# Patient Record
Sex: Female | Born: 1955
Health system: Southern US, Community
[De-identification: ages and names within clinical notes are randomized; demographics above are authoritative.]

## PROBLEM LIST (undated history)

## (undated) DIAGNOSIS — E785 Hyperlipidemia, unspecified: Secondary | ICD-10-CM

## (undated) DIAGNOSIS — F329 Major depressive disorder, single episode, unspecified: Secondary | ICD-10-CM

## (undated) DIAGNOSIS — Z9889 Other specified postprocedural states: Secondary | ICD-10-CM

## (undated) DIAGNOSIS — T7840XA Allergy, unspecified, initial encounter: Secondary | ICD-10-CM

## (undated) DIAGNOSIS — D649 Anemia, unspecified: Secondary | ICD-10-CM

## (undated) DIAGNOSIS — I10 Essential (primary) hypertension: Secondary | ICD-10-CM

## (undated) DIAGNOSIS — S0300XA Dislocation of jaw, unspecified side, initial encounter: Secondary | ICD-10-CM

## (undated) DIAGNOSIS — F32A Depression, unspecified: Secondary | ICD-10-CM

## (undated) DIAGNOSIS — H269 Unspecified cataract: Secondary | ICD-10-CM

## (undated) DIAGNOSIS — R112 Nausea with vomiting, unspecified: Secondary | ICD-10-CM

## (undated) DIAGNOSIS — N951 Menopausal and female climacteric states: Secondary | ICD-10-CM

## (undated) DIAGNOSIS — M1990 Unspecified osteoarthritis, unspecified site: Secondary | ICD-10-CM

## (undated) DIAGNOSIS — M8580 Other specified disorders of bone density and structure, unspecified site: Secondary | ICD-10-CM

## (undated) HISTORY — DX: Depression, unspecified: F32.A

## (undated) HISTORY — DX: Anemia, unspecified: D64.9

## (undated) HISTORY — DX: Essential (primary) hypertension: I10

## (undated) HISTORY — PX: OTHER SURGICAL HISTORY: SHX169

## (undated) HISTORY — DX: Allergy, unspecified, initial encounter: T78.40XA

## (undated) HISTORY — DX: Menopausal and female climacteric states: N95.1

## (undated) HISTORY — DX: Unspecified osteoarthritis, unspecified site: M19.90

## (undated) HISTORY — DX: Other specified postprocedural states: Z98.890

## (undated) HISTORY — DX: Major depressive disorder, single episode, unspecified: F32.9

## (undated) HISTORY — DX: Hyperlipidemia, unspecified: E78.5

## (undated) HISTORY — PX: TONSILLECTOMY: SUR1361

## (undated) HISTORY — DX: Other specified disorders of bone density and structure, unspecified site: M85.80

## (undated) HISTORY — DX: Dislocation of jaw, unspecified side, initial encounter: S03.00XA

## (undated) HISTORY — DX: Unspecified cataract: H26.9

## (undated) HISTORY — DX: Nausea with vomiting, unspecified: R11.2

---

## 2000-04-07 ENCOUNTER — Encounter: Payer: Self-pay | Admitting: Internal Medicine

## 2000-04-07 ENCOUNTER — Encounter: Admission: RE | Admit: 2000-04-07 | Discharge: 2000-04-07 | Payer: Self-pay | Admitting: Internal Medicine

## 2000-06-19 ENCOUNTER — Other Ambulatory Visit: Admission: RE | Admit: 2000-06-19 | Discharge: 2000-06-19 | Payer: Self-pay | Admitting: Internal Medicine

## 2000-12-22 HISTORY — PX: ANKLE SURGERY: SHX546

## 2001-03-02 ENCOUNTER — Encounter: Payer: Self-pay | Admitting: Orthopedic Surgery

## 2001-03-02 ENCOUNTER — Ambulatory Visit (HOSPITAL_COMMUNITY): Admission: RE | Admit: 2001-03-02 | Discharge: 2001-03-02 | Payer: Self-pay | Admitting: Orthopedic Surgery

## 2001-04-15 ENCOUNTER — Encounter: Payer: Self-pay | Admitting: Internal Medicine

## 2001-04-15 ENCOUNTER — Encounter: Admission: RE | Admit: 2001-04-15 | Discharge: 2001-04-15 | Payer: Self-pay | Admitting: Internal Medicine

## 2001-06-17 ENCOUNTER — Other Ambulatory Visit: Admission: RE | Admit: 2001-06-17 | Discharge: 2001-06-17 | Payer: Self-pay | Admitting: Internal Medicine

## 2001-08-09 ENCOUNTER — Encounter: Admission: RE | Admit: 2001-08-09 | Discharge: 2001-08-09 | Payer: Self-pay | Admitting: Internal Medicine

## 2001-08-09 ENCOUNTER — Encounter: Payer: Self-pay | Admitting: Internal Medicine

## 2002-06-02 ENCOUNTER — Other Ambulatory Visit: Admission: RE | Admit: 2002-06-02 | Discharge: 2002-06-02 | Payer: Self-pay | Admitting: Internal Medicine

## 2002-06-29 ENCOUNTER — Encounter: Payer: Self-pay | Admitting: Internal Medicine

## 2002-06-29 ENCOUNTER — Ambulatory Visit (HOSPITAL_COMMUNITY): Admission: RE | Admit: 2002-06-29 | Discharge: 2002-06-29 | Payer: Self-pay | Admitting: Internal Medicine

## 2003-06-16 ENCOUNTER — Other Ambulatory Visit: Admission: RE | Admit: 2003-06-16 | Discharge: 2003-06-16 | Payer: Self-pay | Admitting: Internal Medicine

## 2003-08-07 ENCOUNTER — Encounter: Payer: Self-pay | Admitting: Internal Medicine

## 2003-08-07 ENCOUNTER — Ambulatory Visit (HOSPITAL_COMMUNITY): Admission: RE | Admit: 2003-08-07 | Discharge: 2003-08-07 | Payer: Self-pay | Admitting: Internal Medicine

## 2004-08-14 ENCOUNTER — Encounter: Admission: RE | Admit: 2004-08-14 | Discharge: 2004-08-14 | Payer: Self-pay | Admitting: Internal Medicine

## 2004-08-28 ENCOUNTER — Encounter: Admission: RE | Admit: 2004-08-28 | Discharge: 2004-08-28 | Payer: Self-pay | Admitting: Internal Medicine

## 2004-11-26 ENCOUNTER — Other Ambulatory Visit: Admission: RE | Admit: 2004-11-26 | Discharge: 2004-11-26 | Payer: Self-pay | Admitting: Obstetrics and Gynecology

## 2005-11-19 ENCOUNTER — Encounter: Admission: RE | Admit: 2005-11-19 | Discharge: 2005-11-19 | Payer: Self-pay | Admitting: Internal Medicine

## 2007-08-13 ENCOUNTER — Emergency Department: Payer: Self-pay | Admitting: Emergency Medicine

## 2008-05-18 ENCOUNTER — Other Ambulatory Visit: Admission: RE | Admit: 2008-05-18 | Discharge: 2008-05-18 | Payer: Self-pay | Admitting: Internal Medicine

## 2008-12-22 HISTORY — PX: ROTATOR CUFF REPAIR: SHX139

## 2009-05-02 ENCOUNTER — Ambulatory Visit: Payer: Self-pay | Admitting: Internal Medicine

## 2009-06-22 ENCOUNTER — Other Ambulatory Visit: Admission: RE | Admit: 2009-06-22 | Discharge: 2009-06-22 | Payer: Self-pay | Admitting: Internal Medicine

## 2009-06-22 ENCOUNTER — Ambulatory Visit: Payer: Self-pay | Admitting: Internal Medicine

## 2009-07-16 ENCOUNTER — Encounter: Admission: RE | Admit: 2009-07-16 | Discharge: 2009-07-16 | Payer: Self-pay | Admitting: Internal Medicine

## 2009-08-13 ENCOUNTER — Ambulatory Visit: Payer: Self-pay | Admitting: Internal Medicine

## 2009-09-17 ENCOUNTER — Encounter: Admission: RE | Admit: 2009-09-17 | Discharge: 2009-09-17 | Payer: Self-pay | Admitting: Orthopedic Surgery

## 2009-10-02 ENCOUNTER — Ambulatory Visit: Payer: Self-pay | Admitting: Internal Medicine

## 2010-02-08 ENCOUNTER — Ambulatory Visit: Payer: Self-pay | Admitting: Internal Medicine

## 2010-02-22 ENCOUNTER — Ambulatory Visit: Payer: Self-pay | Admitting: Internal Medicine

## 2010-08-12 ENCOUNTER — Encounter: Admission: RE | Admit: 2010-08-12 | Discharge: 2010-08-12 | Payer: Self-pay | Admitting: Internal Medicine

## 2010-09-17 ENCOUNTER — Ambulatory Visit: Payer: Self-pay | Admitting: Internal Medicine

## 2010-09-17 ENCOUNTER — Other Ambulatory Visit: Admission: RE | Admit: 2010-09-17 | Discharge: 2010-09-17 | Payer: Self-pay | Admitting: Internal Medicine

## 2010-10-15 ENCOUNTER — Ambulatory Visit: Payer: Self-pay | Admitting: Internal Medicine

## 2011-01-02 ENCOUNTER — Ambulatory Visit
Admission: RE | Admit: 2011-01-02 | Discharge: 2011-01-02 | Payer: Self-pay | Source: Home / Self Care | Attending: Internal Medicine | Admitting: Internal Medicine

## 2011-01-12 ENCOUNTER — Encounter: Payer: Self-pay | Admitting: Internal Medicine

## 2011-03-18 ENCOUNTER — Ambulatory Visit (INDEPENDENT_AMBULATORY_CARE_PROVIDER_SITE_OTHER): Payer: Self-pay | Admitting: Internal Medicine

## 2011-03-18 DIAGNOSIS — I10 Essential (primary) hypertension: Secondary | ICD-10-CM

## 2011-09-01 ENCOUNTER — Other Ambulatory Visit: Payer: Self-pay | Admitting: Internal Medicine

## 2011-09-02 ENCOUNTER — Other Ambulatory Visit: Payer: Self-pay | Admitting: Internal Medicine

## 2011-09-09 ENCOUNTER — Other Ambulatory Visit: Payer: Self-pay | Admitting: Internal Medicine

## 2011-09-09 DIAGNOSIS — Z1231 Encounter for screening mammogram for malignant neoplasm of breast: Secondary | ICD-10-CM

## 2011-09-22 ENCOUNTER — Ambulatory Visit
Admission: RE | Admit: 2011-09-22 | Discharge: 2011-09-22 | Disposition: A | Discharge status: Home / Self Care | Payer: 59 | Source: Ambulatory Visit | Attending: Internal Medicine | Admitting: Internal Medicine

## 2011-09-22 DIAGNOSIS — Z1231 Encounter for screening mammogram for malignant neoplasm of breast: Secondary | ICD-10-CM

## 2011-09-23 ENCOUNTER — Encounter: Payer: Self-pay | Admitting: Internal Medicine

## 2011-09-26 ENCOUNTER — Encounter: Payer: Self-pay | Admitting: Internal Medicine

## 2011-09-30 ENCOUNTER — Encounter: Payer: Self-pay | Admitting: Internal Medicine

## 2011-09-30 ENCOUNTER — Ambulatory Visit (INDEPENDENT_AMBULATORY_CARE_PROVIDER_SITE_OTHER): Payer: 59 | Admitting: Internal Medicine

## 2011-09-30 VITALS — BP 109/62 | HR 78 | Temp 97.7°F | Ht 62.0 in | Wt 159.0 lb

## 2011-09-30 DIAGNOSIS — I10 Essential (primary) hypertension: Secondary | ICD-10-CM

## 2011-09-30 DIAGNOSIS — M8580 Other specified disorders of bone density and structure, unspecified site: Secondary | ICD-10-CM

## 2011-09-30 DIAGNOSIS — F329 Major depressive disorder, single episode, unspecified: Secondary | ICD-10-CM

## 2011-09-30 DIAGNOSIS — F32A Depression, unspecified: Secondary | ICD-10-CM

## 2011-09-30 DIAGNOSIS — Z23 Encounter for immunization: Secondary | ICD-10-CM

## 2011-09-30 DIAGNOSIS — E785 Hyperlipidemia, unspecified: Secondary | ICD-10-CM

## 2011-09-30 DIAGNOSIS — Z0000 Encounter for general adult medical examination without abnormal findings: Secondary | ICD-10-CM

## 2011-09-30 MED ORDER — LOSARTAN POTASSIUM 100 MG PO TABS: 100.0000 mg | ORAL_TABLET | Freq: Every day | ORAL | Status: DC

## 2011-10-13 ENCOUNTER — Encounter: Payer: Self-pay | Admitting: Internal Medicine

## 2011-10-14 ENCOUNTER — Telehealth: Payer: Self-pay

## 2011-10-14 NOTE — Telephone Encounter (Signed)
Order placed in Epic for Amb Ref to [facility] for screening colon

## 2011-10-26 DIAGNOSIS — F32A Depression, unspecified: Secondary | ICD-10-CM | POA: Insufficient documentation

## 2011-10-26 DIAGNOSIS — I10 Essential (primary) hypertension: Secondary | ICD-10-CM | POA: Insufficient documentation

## 2011-10-26 DIAGNOSIS — E785 Hyperlipidemia, unspecified: Secondary | ICD-10-CM | POA: Insufficient documentation

## 2011-10-26 DIAGNOSIS — F329 Major depressive disorder, single episode, unspecified: Secondary | ICD-10-CM | POA: Insufficient documentation

## 2011-10-26 DIAGNOSIS — M8580 Other specified disorders of bone density and structure, unspecified site: Secondary | ICD-10-CM | POA: Insufficient documentation

## 2011-10-26 NOTE — Patient Instructions (Signed)
Try the diet exercise and lose a bit of weight. Return in 6 months for office visit and blood pressure check. Check fasting lipid panel at that time.

## 2011-10-26 NOTE — Progress Notes (Signed)
  Subjective:    Patient ID: Taylor Moreno, female    DOB: December 21, 1956, 55 y.o.   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 History of hypertension, depression, hyperlipidemia, hot flashes, osteopenia. History of fractured left wrist in the late 1980s. History of iron deficiency anemia 1999 related to menses. Agrees to have colonoscopy January 2013. Had mammogram August 2011. No known drug allergies but Fosamax causes GI upset.  Had tonsillectomy as a teenager, right knee surgery for torn cartilage December 2002, left knee surgery for torn cartilage October 2003. Had tetanus immunization 2008.  Has been a patient in this practice since 1990.  Used to smoke cigarettes. Quit smoking in the mid 1980s but formerly smoked one half to a pack cigarettes daily for 8 years.. Social alcohol consumption. Completed college.  Family history parents in good health 2 sisters in good health  Social history patient is single never married no children    Review of Systems  Constitutional: Negative.   HENT: Negative.   Eyes: Negative.   Respiratory: Negative.   Cardiovascular: Negative.   Gastrointestinal: Negative.   Genitourinary: Negative.   Musculoskeletal: Negative.   Neurological: Negative.   Hematological: Negative.   Psychiatric/Behavioral: Negative.        Objective:   Physical Exam  Vitals reviewed. Constitutional: She is oriented to person, place, and time. She appears well-developed and well-nourished.  HENT:  Head: Normocephalic and atraumatic.  Right Ear: External ear normal.  Left Ear: External ear normal.  Mouth/Throat: Oropharynx is clear and moist. No oropharyngeal exudate.  Eyes: Conjunctivae and EOM are normal. Pupils are equal, round, and reactive to light. No scleral icterus.  Neck: Neck supple. No JVD present. No thyromegaly present.  Cardiovascular:  Normal rate, regular rhythm, normal heart sounds and intact distal pulses.   No murmur heard. Pulmonary/Chest: Effort normal and breath sounds normal. She has no wheezes. She has no rales.       Breasts normal female  Abdominal: Soft. Bowel sounds are normal. She exhibits no mass. There is no tenderness.  Genitourinary:       Deferred last Pap September 2011  Musculoskeletal: Normal range of motion. She exhibits no edema.  Lymphadenopathy:    She has no cervical adenopathy.  Neurological: She is alert and oriented to person, place, and time. She has normal reflexes. She displays normal reflexes. No cranial nerve deficit. Coordination normal.  Skin: Skin is warm and dry. No rash noted.  Psychiatric: She has a normal mood and affect. Her behavior is normal.          Assessment & Plan:  Pression  Hypertension  Hyperlipidemia  Osteopenia  Hot flashes  Plan: Patient is to continue with same regimen and return in 6 months for office visit, lipid panel blood pressure check. Patient is currently not taking lipid-lowering medication. Says it caused myalgias

## 2011-12-08 ENCOUNTER — Other Ambulatory Visit: Payer: Self-pay | Admitting: Internal Medicine

## 2011-12-20 ENCOUNTER — Other Ambulatory Visit: Payer: Self-pay | Admitting: Internal Medicine

## 2011-12-23 HISTORY — PX: COLONOSCOPY: SHX174

## 2012-02-09 ENCOUNTER — Encounter: Payer: Self-pay | Admitting: Gastroenterology

## 2012-03-23 ENCOUNTER — Encounter: Payer: Self-pay | Admitting: Gastroenterology

## 2012-04-02 ENCOUNTER — Ambulatory Visit (INDEPENDENT_AMBULATORY_CARE_PROVIDER_SITE_OTHER): Payer: 59 | Admitting: Internal Medicine

## 2012-04-02 ENCOUNTER — Encounter: Payer: Self-pay | Admitting: Internal Medicine

## 2012-04-02 VITALS — BP 126/78 | HR 76 | Temp 99.1°F | Wt 167.5 lb

## 2012-04-02 DIAGNOSIS — F3289 Other specified depressive episodes: Secondary | ICD-10-CM

## 2012-04-02 DIAGNOSIS — I10 Essential (primary) hypertension: Secondary | ICD-10-CM

## 2012-04-02 DIAGNOSIS — E669 Obesity, unspecified: Secondary | ICD-10-CM

## 2012-04-02 DIAGNOSIS — F329 Major depressive disorder, single episode, unspecified: Secondary | ICD-10-CM

## 2012-04-02 DIAGNOSIS — F32A Depression, unspecified: Secondary | ICD-10-CM

## 2012-04-02 DIAGNOSIS — E785 Hyperlipidemia, unspecified: Secondary | ICD-10-CM

## 2012-04-02 DIAGNOSIS — R7309 Other abnormal glucose: Secondary | ICD-10-CM

## 2012-04-02 DIAGNOSIS — R7302 Impaired glucose tolerance (oral): Secondary | ICD-10-CM

## 2012-04-13 ENCOUNTER — Ambulatory Visit (INDEPENDENT_AMBULATORY_CARE_PROVIDER_SITE_OTHER): Payer: 59 | Admitting: Internal Medicine

## 2012-04-13 ENCOUNTER — Encounter: Payer: Self-pay | Admitting: Internal Medicine

## 2012-04-13 VITALS — BP 130/72 | HR 88 | Temp 99.4°F | Resp 24 | Ht 63.0 in | Wt 168.0 lb

## 2012-04-13 DIAGNOSIS — H6500 Acute serous otitis media, unspecified ear: Secondary | ICD-10-CM

## 2012-04-13 DIAGNOSIS — J988 Other specified respiratory disorders: Secondary | ICD-10-CM

## 2012-04-13 DIAGNOSIS — J40 Bronchitis, not specified as acute or chronic: Secondary | ICD-10-CM

## 2012-04-13 DIAGNOSIS — J9801 Acute bronchospasm: Secondary | ICD-10-CM

## 2012-04-13 DIAGNOSIS — H6503 Acute serous otitis media, bilateral: Secondary | ICD-10-CM

## 2012-04-13 MED ORDER — ALBUTEROL SULFATE (5 MG/ML) 0.5% IN NEBU
2.5000 mg | INHALATION_SOLUTION | Freq: Once | RESPIRATORY_TRACT | Status: AC
  Administered 2012-04-13: 2.5 mg via RESPIRATORY_TRACT

## 2012-04-13 NOTE — Patient Instructions (Signed)
Take prednisone in tapering course as prescribed. Take antibiotics as prescribed. Use inhaler 4 times daily.

## 2012-04-13 NOTE — Progress Notes (Signed)
  Subjective:    Patient ID: Taylor Moreno, female    DOB: [birth year], 56 y.o.   MRN: [id number]  HPI 56 year old white female who developed respiratory infection 6 days ago. Has had fever up to 102 with coughing and wheezing. Some discolored sputum production. No discolored nasal drainage. Ears feel congested.    Review of Systems     Objective:   Physical Exam chest exam bilateral rhonchi and wheezing. Was given DuoNeb nebulizer treatment in the office. Pulse oximetry 93% on room air before treatment. TMs are full bilaterally but not red; pharynx is clear.        Assessment & Plan:  Bronchitis  Bronchospasm  Acute bilateral serous otitis media  Plan: Albuterol inhaler 2 sprays by mouth 4 times a day, Sterapred DS 10 mg 6 day dosepak take as directed; Levaquin 500 milligrams daily for 10 days. Diflucan 150 mg tablet with one refill should Candida vaginitis developed on antibiotics.

## 2012-04-19 ENCOUNTER — Ambulatory Visit (AMBULATORY_SURGERY_CENTER): Payer: 59 | Admitting: *Deleted

## 2012-04-19 VITALS — Ht 63.0 in | Wt 170.0 lb

## 2012-04-19 DIAGNOSIS — Z1211 Encounter for screening for malignant neoplasm of colon: Secondary | ICD-10-CM

## 2012-04-19 MED ORDER — PEG-KCL-NACL-NASULF-NA ASC-C 100 G PO SOLR: ORAL | Status: DC

## 2012-04-20 ENCOUNTER — Encounter: Payer: Self-pay | Admitting: Gastroenterology

## 2012-04-21 NOTE — Patient Instructions (Addendum)
Please try the diet exercise and lose weight. Continue same medications and return for physical exam in 6 months. Lab work in 6 months to be done by [facility] should include hemoglobin A1c

## 2012-04-21 NOTE — Progress Notes (Signed)
  Subjective:    Patient ID: Taylor Moreno, female    DOB: 27-Oct-1956, 56 y.o.   MRN: [id number]  HPI 56 year old white female with history of hypertension and depression in today for six-month recheck. No complaints or problems. Feels well. Treated with losartan 100 mg daily for hypertension. Takes Lexapro generic 10 mg daily. Issues with obesity. Doesn't exercise very much. History of mild hyperlipidemia with labs done by [employer] where she is employed September 2012 showing total cholesterol 216, HDL cholesterol 71 normal triglycerides and LDL cholesterol of 129. Tried Lipitor in 2008 but discontinued it. History of osteopenia. History of colonoscopy January 2013. Also at risk for glucose intolerance with fasting serum glucose September 2012 of 160    Review of Systems     Objective:   Physical Exam chest clear.dictation, cardiac exam regular rate and rhythm, extremities without edema.        Assessment & Plan:  Hypertension  Hyperlipidemia  Obesity  Osteopenia  History of depression  Suspect glucose intolerance  Plan: Return in 6 months for physical examination. Encouraged diet and exercise and weight loss.

## 2012-05-03 ENCOUNTER — Encounter: Payer: Self-pay | Admitting: Gastroenterology

## 2012-05-03 ENCOUNTER — Ambulatory Visit (AMBULATORY_SURGERY_CENTER): Payer: 59 | Admitting: Gastroenterology

## 2012-05-03 VITALS — BP 131/92 | HR 70 | Temp 98.3°F | Resp 14 | Ht 63.0 in | Wt 170.0 lb

## 2012-05-03 DIAGNOSIS — Z1211 Encounter for screening for malignant neoplasm of colon: Secondary | ICD-10-CM

## 2012-05-03 MED ORDER — SODIUM CHLORIDE 0.9 % IV SOLN: 500.0000 mL | INTRAVENOUS | Status: DC

## 2012-05-03 NOTE — Progress Notes (Signed)
Pressure applied to the abdomen to reach cecum. 

## 2012-05-03 NOTE — Patient Instructions (Signed)

## 2012-05-03 NOTE — Op Note (Signed)
[facility] [address] [place], [place]  [address]  COLONOSCOPY PROCEDURE REPORT  PATIENT:  Taylor Moreno, Taylor Moreno  MR#:  [id number] BIRTHDATE:  [birth year], 56 yrs. old  GENDER:  female ENDOSCOPIST:  Vania Rea. Jarold Motto, MD, [facility] REF. BY:  Sharlet Salina, M.D. PROCEDURE DATE:  05/03/2012 PROCEDURE:  Average-risk screening colonoscopy G0121 ASA CLASS:  Class II INDICATIONS:  Routine Risk Screening MEDICATIONS:   propofol (Diprivan) 350 mg IV  DESCRIPTION OF PROCEDURE:   After the risks and benefits and of the procedure were explained, informed consent was obtained. Digital rectal exam was performed and revealed no abnormalities. The LB CF-[id number] endoscope was introduced through the anus and advanced to the cecum, which was identified by both the appendix and ileocecal valve.  The quality of the prep was excellent, using MoviPrep.  The instrument was then slowly withdrawn as the colon was fully examined. <<PROCEDUREIMAGES>>  FINDINGS:  No polyps or cancers were seen.  This was otherwise a normal examination of the colon.   Retroflexed views in the rectum revealed no abnormalities.    The scope was then withdrawn from the patient and the procedure completed.  COMPLICATIONS:  None ENDOSCOPIC IMPRESSION: 1) No polyps or cancers 2) Otherwise normal examination RECOMMENDATIONS: 1) Continue current colorectal screening recommendations for "routine risk" patients with a repeat colonoscopy in 10 years.  REPEAT EXAM:  No  ______________________________ Vania Rea. Jarold Motto, MD, [facility]  CC:  n. eSIGNED:   Vania Rea. Meena Barrantes at 05/03/2012 11:32 AM  Guerry Minors, [phone]

## 2012-05-03 NOTE — Progress Notes (Signed)
Patient did not experience any of the following events: a burn prior to discharge; a fall within the facility; wrong site/side/patient/procedure/implant event; or a hospital transfer or hospital admission upon discharge from the facility. (G8907) Patient did not have preoperative order for IV antibiotic SSI prophylaxis. (G8918)  

## 2012-05-04 ENCOUNTER — Telehealth: Payer: Self-pay | Admitting: *Deleted

## 2012-05-04 NOTE — Telephone Encounter (Signed)
  Follow up Call-  Call back number 05/03/2012  Post procedure Call Back phone  # [phone]  Permission to leave phone message Yes     Left message on answering machine.

## 2012-07-09 ENCOUNTER — Encounter: Payer: Self-pay | Admitting: Internal Medicine

## 2012-07-09 ENCOUNTER — Ambulatory Visit (INDEPENDENT_AMBULATORY_CARE_PROVIDER_SITE_OTHER): Payer: 59 | Admitting: Internal Medicine

## 2012-07-09 VITALS — BP 132/80 | HR 84 | Temp 99.5°F | Wt 168.0 lb

## 2012-07-09 DIAGNOSIS — F32A Depression, unspecified: Secondary | ICD-10-CM

## 2012-07-09 DIAGNOSIS — R229 Localized swelling, mass and lump, unspecified: Secondary | ICD-10-CM

## 2012-07-09 DIAGNOSIS — F329 Major depressive disorder, single episode, unspecified: Secondary | ICD-10-CM

## 2012-07-09 DIAGNOSIS — Z6379 Other stressful life events affecting family and household: Secondary | ICD-10-CM

## 2012-07-09 DIAGNOSIS — R2240 Localized swelling, mass and lump, unspecified lower limb: Secondary | ICD-10-CM

## 2012-07-09 NOTE — Progress Notes (Signed)
  Subjective:    Patient ID: Taylor Moreno, female    DOB: Jul 24, 1956, 56 y.o.   MRN: [id number]  HPI Mother has had 2 strokes. First stroke was in May and was a hemorrhagic stroke on anticoagulants;  the second one was an embolic stroke from atrial fibrillation. She is now under the care of Hospice. Her father is 71 years old. Taylor Moreno has been a lot of time with her parents recently. She's been under some stress. She noticed a nodule recently on the sole of her left foot. as it was pointed out by a nail technician. It's tender to touch. She also needs refills on her antidepressant medication. Doesn't recall any injury to the foot or any foreign body in foot.      Review of Systems     Objective:   Physical Exam Pea-sized superficial nodule sole of left foot just below the first metatarsal head. It is tender to palpation. There is no erythema or drainage from this nodule.       Assessment & Plan:  Benign nodule sole of left foot  Depression  Family stress  Plan: Patient is going to monitor it for now. If pain persists, we will consider getting an MRI of the foot. Possibilities include Morton's neuroma or just a benign nodule.  Fax when necessary one year refills on medications to mail order prescription plan.

## 2012-07-09 NOTE — Patient Instructions (Addendum)
Observe foot mass for enlargement. Wear comfortable shoes. May take ibuprofen for pain. May use ice for 20 minutes daily on sole of foot if painful.

## 2012-07-21 DIAGNOSIS — Z0289 Encounter for other administrative examinations: Secondary | ICD-10-CM

## 2012-08-26 ENCOUNTER — Other Ambulatory Visit: Payer: Self-pay | Admitting: Internal Medicine

## 2012-08-26 DIAGNOSIS — Z1231 Encounter for screening mammogram for malignant neoplasm of breast: Secondary | ICD-10-CM

## 2012-09-21 ENCOUNTER — Telehealth: Payer: Self-pay | Admitting: Internal Medicine

## 2012-09-21 NOTE — Telephone Encounter (Signed)
Order for fasting labs please

## 2012-09-22 NOTE — Telephone Encounter (Signed)
Order faxed to [facility] for bloodwork, as requested.

## 2012-09-23 ENCOUNTER — Ambulatory Visit
Admission: RE | Admit: 2012-09-23 | Discharge: 2012-09-23 | Disposition: A | Discharge status: Home / Self Care | Payer: 59 | Source: Ambulatory Visit | Attending: Internal Medicine | Admitting: Internal Medicine

## 2012-09-23 DIAGNOSIS — Z1231 Encounter for screening mammogram for malignant neoplasm of breast: Secondary | ICD-10-CM

## 2012-10-04 ENCOUNTER — Ambulatory Visit (INDEPENDENT_AMBULATORY_CARE_PROVIDER_SITE_OTHER): Payer: 59 | Admitting: Internal Medicine

## 2012-10-04 ENCOUNTER — Encounter: Payer: Self-pay | Admitting: Internal Medicine

## 2012-10-04 VITALS — BP 108/64 | HR 92 | Temp 99.2°F | Ht 62.5 in | Wt 171.0 lb

## 2012-10-04 DIAGNOSIS — M899 Disorder of bone, unspecified: Secondary | ICD-10-CM

## 2012-10-04 DIAGNOSIS — I10 Essential (primary) hypertension: Secondary | ICD-10-CM

## 2012-10-04 DIAGNOSIS — F32A Depression, unspecified: Secondary | ICD-10-CM

## 2012-10-04 DIAGNOSIS — E785 Hyperlipidemia, unspecified: Secondary | ICD-10-CM

## 2012-10-04 DIAGNOSIS — F439 Reaction to severe stress, unspecified: Secondary | ICD-10-CM

## 2012-10-04 DIAGNOSIS — Z733 Stress, not elsewhere classified: Secondary | ICD-10-CM

## 2012-10-04 DIAGNOSIS — F329 Major depressive disorder, single episode, unspecified: Secondary | ICD-10-CM

## 2012-10-04 DIAGNOSIS — M8580 Other specified disorders of bone density and structure, unspecified site: Secondary | ICD-10-CM

## 2012-10-04 DIAGNOSIS — Z23 Encounter for immunization: Secondary | ICD-10-CM

## 2012-10-04 NOTE — Progress Notes (Signed)
Subjective:    Patient ID: Taylor Moreno, female    DOB: 17-Sep-1956, 56 y.o.   MRN: [id number]  HPI 56 year old white female with history of hypertension, hyperlipidemia, osteopenia and depression as well as obesity in today for health maintenance and evaluation of medical problems. No known drug allergies. Fosamax causes GI symptoms.  History of tonsillectomy as a teenager. History of right knee surgery for torn cartilage December 2002. Left knee surgery by Dr. Brynda Greathouse for torn cartilage October 2003.  Colonoscopy done January 2013. Gets annual mammogram. Is on calcium and vitamin D supplement. Takes Lexapro 10 mg one half tablet daily. Was on Lipitor starting in 2008 but discontinued it. Started on Cozaar September 2011.  Social history: She is single. Works in Consulting civil engineer for [employer]. Formerly smoked a half pack daily for some 10 years. Quit smoking around 1984. Has a college education. In the 1990s she had a boyfriend. Have her she says she's not seeing anyone at the present time. Social alcohol consumption. Fractured left wrist in the late 1980s. This occurred secondary to catching her arm posterior Barnetta Chapel had some hyper rotational injury resulting in fracture.  Family history: Parents living but her aging and have issues with dementia and needs assistance.  2 sisters in good health. No children. Never married.  Recent lab work done through [employer] shows LDL cholesterol of 144 and total cholesterol of 2:30. TSH normal at 3.020. Vitamin D normal at 37.5. Serum glucose normal. CBC normal.    Review of Systems  Constitutional: Positive for fatigue.       A lot of time taking care of parents  HENT: Negative.   Eyes: Negative.   Respiratory: Negative.   Cardiovascular: Negative.   Gastrointestinal: Negative.   Endocrine: Negative.   Genitourinary: Negative.   Neurological: Negative.   Psychiatric/Behavioral:       History of depression       Objective:   Physical Exam  Nursing note  reviewed. Constitutional: She is oriented to person, place, and time. She appears well-developed and well-nourished. No distress.  HENT:  Head: Normocephalic and atraumatic.  Left Ear: External ear normal.  Mouth/Throat: Oropharynx is clear and moist. No oropharyngeal exudate.  Eyes: Conjunctivae and EOM are normal. Pupils are equal, round, and reactive to light. Right eye exhibits no discharge. Left eye exhibits no discharge.  Neck: Neck supple. No JVD present. No thyromegaly present.  Cardiovascular: Normal rate, regular rhythm, normal heart sounds and intact distal pulses.   No murmur heard. Pulmonary/Chest: Breath sounds normal. No respiratory distress. She has no wheezes. She has no rales. She exhibits no tenderness.  Breasts normal female  Abdominal: Soft. Bowel sounds are normal. She exhibits no distension and no mass. There is no tenderness. There is no rebound and no guarding.  Musculoskeletal: She exhibits no edema.  Neurological: She is alert and oriented to person, place, and time. She has normal reflexes. No cranial nerve deficit. Coordination normal.  Skin: No rash noted. She is not diaphoretic.  Psychiatric: She has a normal mood and affect. Her behavior is normal. Judgment and thought content normal.          Assessment & Plan:  History of depression  History of hypertension  History of hyperlipidemia does not want to be on statin therapy  Osteopenia-treat with vitamin D and calcium  Plan: Continue Cozaar 100 mg daily. Continue low-dose Lexapro. Prescription given to have zoster vaccine done if pharmacy. Influenza immunization given here today. Return in 6  months.

## 2013-03-06 NOTE — Patient Instructions (Addendum)
Continue same medications and return in 6 months. Try the diet exercise. Need repeat lipid panel in 6 months.

## 2013-03-28 ENCOUNTER — Encounter: Payer: Self-pay | Admitting: Internal Medicine

## 2013-03-28 ENCOUNTER — Ambulatory Visit (INDEPENDENT_AMBULATORY_CARE_PROVIDER_SITE_OTHER): Payer: 59 | Admitting: Internal Medicine

## 2013-03-28 VITALS — BP 132/76 | HR 84 | Temp 99.6°F | Wt 173.0 lb

## 2013-03-28 DIAGNOSIS — F32A Depression, unspecified: Secondary | ICD-10-CM

## 2013-03-28 DIAGNOSIS — F329 Major depressive disorder, single episode, unspecified: Secondary | ICD-10-CM

## 2013-03-28 DIAGNOSIS — E785 Hyperlipidemia, unspecified: Secondary | ICD-10-CM

## 2013-03-28 DIAGNOSIS — I10 Essential (primary) hypertension: Secondary | ICD-10-CM

## 2013-08-18 ENCOUNTER — Encounter: Payer: Self-pay | Admitting: Internal Medicine

## 2013-08-18 ENCOUNTER — Ambulatory Visit (INDEPENDENT_AMBULATORY_CARE_PROVIDER_SITE_OTHER): Payer: 59 | Admitting: Internal Medicine

## 2013-08-18 VITALS — BP 120/76 | HR 80 | Temp 99.4°F | Wt 173.0 lb

## 2013-08-18 DIAGNOSIS — R040 Epistaxis: Secondary | ICD-10-CM

## 2013-08-18 LAB — CBC WITH DIFFERENTIAL/PLATELET
Basophils Absolute: 0.1 10*3/uL (ref 0.0–0.1)
HCT: 36.6 % (ref 36.0–46.0)
Hemoglobin: 12.3 g/dL (ref 12.0–15.0)
Lymphs Abs: 1.8 10*3/uL (ref 0.7–4.0)
MCH: 31.9 pg (ref 26.0–34.0)
MCHC: 33.6 g/dL (ref 30.0–36.0)
MCV: 94.8 fL (ref 78.0–100.0)
Neutrophils Relative %: 53 % (ref 43–77)
RBC: 3.86 MIL/uL — ABNORMAL LOW (ref 3.87–5.11)

## 2013-08-18 LAB — APTT: aPTT: 31 seconds (ref 24–37)

## 2013-08-18 LAB — PROTIME-INR: INR: 0.9 (ref <1.50)

## 2013-08-18 NOTE — Addendum Note (Signed)
Addended by: Judy Pimple on: 08/18/2013 11:29 AM   Modules accepted: Orders

## 2013-08-18 NOTE — Progress Notes (Signed)
  Subjective:    Patient ID: Taylor Moreno, female    DOB: 02-Aug-1956, 57 y.o.   MRN: [id number]  HPI Had 30 minutes nosebleed yesterday despite pinching nostrils. Happening more frequently. Has been an issue for a couple of years. Stopped Allegra thinking it was drying out nasal tissue and stopped Flonase for same reason. History of allergic rhinitis.  Hx of  well controlled. Hx of depression treated with SSRI. Denies taking ASA and NSAIDS.    Review of Systems     Objective:   Physical Exam Boggy nasal mucosa left nostril with no active bleeding. BP stable on medication        Assessment & Plan:  Epistaxis Allergic Rhinitis  HTN- well controlled  Plan: ENT evaluation. CBC with diff, PT, PTT drawn

## 2013-08-18 NOTE — Patient Instructions (Addendum)
Refer to ENT. Labs pending

## 2013-09-02 ENCOUNTER — Other Ambulatory Visit: Payer: Self-pay

## 2013-09-02 ENCOUNTER — Other Ambulatory Visit: Payer: Self-pay | Admitting: Internal Medicine

## 2013-09-02 DIAGNOSIS — M8580 Other specified disorders of bone density and structure, unspecified site: Secondary | ICD-10-CM

## 2013-09-02 DIAGNOSIS — Z1231 Encounter for screening mammogram for malignant neoplasm of breast: Secondary | ICD-10-CM

## 2013-09-21 ENCOUNTER — Encounter: Payer: Self-pay | Admitting: Internal Medicine

## 2013-09-21 NOTE — Patient Instructions (Addendum)
Continue same meds and return in 6 months for physical exam

## 2013-09-21 NOTE — Progress Notes (Signed)
  Subjective:    Patient ID: Taylor Moreno, female    DOB: Oct 16, 1956, 57 y.o.   MRN: [id number]  HPI In today for six-month recheck of hypertension, hyperlipidemia and depression. Having family issues taking care of elderly father. Not able to diet and exercise very much because of stress. Continues to work full-time at [employer].   Review of Systems     Objective:   Physical Exam Neck is supple without JVD thyromegaly or carotid bruits. Chest clear to auscultation. Cardiac exam regular rate and rhythm normal S1 and S2. Extremities without edema       Assessment & Plan:  Depression-symptoms stable given situational stress  Situational stress with elderly parents  Hypertension-stable on losartan.  Hyperlipidemia-stable. Not on statin therapy.  Plan: Return in 6 months for physical exam. Continue same medications.  Labs are done through employment at [employer] so they are not an Epic. In the future they need to be scanned into Epic

## 2013-09-26 ENCOUNTER — Ambulatory Visit
Admission: RE | Admit: 2013-09-26 | Discharge: 2013-09-26 | Disposition: A | Discharge status: Home / Self Care | Payer: 59 | Source: Ambulatory Visit

## 2013-09-26 ENCOUNTER — Ambulatory Visit
Admission: RE | Admit: 2013-09-26 | Discharge: 2013-09-26 | Disposition: A | Discharge status: Home / Self Care | Payer: 59 | Source: Ambulatory Visit | Attending: Internal Medicine | Admitting: Internal Medicine

## 2013-09-26 DIAGNOSIS — Z1231 Encounter for screening mammogram for malignant neoplasm of breast: Secondary | ICD-10-CM

## 2013-09-26 DIAGNOSIS — M8580 Other specified disorders of bone density and structure, unspecified site: Secondary | ICD-10-CM

## 2013-10-04 ENCOUNTER — Ambulatory Visit (INDEPENDENT_AMBULATORY_CARE_PROVIDER_SITE_OTHER): Payer: 59 | Admitting: Internal Medicine

## 2013-10-04 ENCOUNTER — Other Ambulatory Visit (HOSPITAL_COMMUNITY)
Admission: RE | Admit: 2013-10-04 | Discharge: 2013-10-04 | Disposition: A | Discharge status: Home / Self Care | Payer: 59 | Source: Ambulatory Visit | Attending: Internal Medicine | Admitting: Internal Medicine

## 2013-10-04 ENCOUNTER — Encounter: Payer: Self-pay | Admitting: Internal Medicine

## 2013-10-04 VITALS — BP 148/80 | Temp 99.6°F | Ht 62.5 in | Wt 178.0 lb

## 2013-10-04 DIAGNOSIS — Z23 Encounter for immunization: Secondary | ICD-10-CM

## 2013-10-04 DIAGNOSIS — Z01419 Encounter for gynecological examination (general) (routine) without abnormal findings: Secondary | ICD-10-CM | POA: Insufficient documentation

## 2013-10-04 DIAGNOSIS — Z0000 Encounter for general adult medical examination without abnormal findings: Secondary | ICD-10-CM

## 2013-10-04 DIAGNOSIS — E785 Hyperlipidemia, unspecified: Secondary | ICD-10-CM

## 2013-10-04 DIAGNOSIS — M899 Disorder of bone, unspecified: Secondary | ICD-10-CM

## 2013-10-04 DIAGNOSIS — I10 Essential (primary) hypertension: Secondary | ICD-10-CM

## 2013-10-04 DIAGNOSIS — F329 Major depressive disorder, single episode, unspecified: Secondary | ICD-10-CM

## 2013-10-04 DIAGNOSIS — F32A Depression, unspecified: Secondary | ICD-10-CM

## 2013-10-04 DIAGNOSIS — M8580 Other specified disorders of bone density and structure, unspecified site: Secondary | ICD-10-CM

## 2013-10-13 ENCOUNTER — Other Ambulatory Visit: Payer: Self-pay | Admitting: Internal Medicine

## 2013-11-20 ENCOUNTER — Other Ambulatory Visit: Payer: Self-pay | Admitting: Internal Medicine

## 2014-01-09 ENCOUNTER — Encounter: Payer: Self-pay | Admitting: Internal Medicine

## 2014-01-09 ENCOUNTER — Ambulatory Visit (INDEPENDENT_AMBULATORY_CARE_PROVIDER_SITE_OTHER): Payer: 59 | Admitting: Internal Medicine

## 2014-01-09 VITALS — BP 136/82 | HR 80 | Temp 99.8°F | Wt 171.0 lb

## 2014-01-09 DIAGNOSIS — J111 Influenza due to unidentified influenza virus with other respiratory manifestations: Secondary | ICD-10-CM

## 2014-01-09 DIAGNOSIS — J209 Acute bronchitis, unspecified: Secondary | ICD-10-CM

## 2014-01-09 MED ORDER — OSELTAMIVIR PHOSPHATE 75 MG PO CAPS: 75.0000 mg | ORAL_CAPSULE | Freq: Two times a day (BID) | ORAL | Status: DC

## 2014-01-09 MED ORDER — HYDROCODONE-HOMATROPINE 5-1.5 MG/5ML PO SYRP: 5.0000 mL | ORAL_SOLUTION | Freq: Three times a day (TID) | ORAL | Status: DC | PRN

## 2014-01-09 MED ORDER — AZITHROMYCIN 250 MG PO TABS: ORAL_TABLET | ORAL | Status: DC

## 2014-01-09 NOTE — Progress Notes (Signed)
   Subjective:    Patient ID: Taylor Moreno, female    DOB: August 19, 1956, 58 y.o.   MRN: [id number]  HPI   Onset evening of January 16th of tickle in throat progressing to cough and fever. Has myalgias, decreased appetite, denies sore throat. No nausea and vomiting. Has had temp up to 103.     Review of Systems     Objective:   Physical Exam looks fatigued. Pharynx only very slightly injected without exudate. TMs clear. Neck supple. Chest clear.        Assessment & Plan:   Influenza  Bronchitis  Plan: Tamiflu 75 mg twice daily for 5 days. Hycodan 8 ounces 1 teaspoon by mouth Q8 hours when necessary cough. Zithromax Z-PAK take 2 tablets day one followed by 1 tablet days 2 through 5. Out of work until afebrile for 48 hours and feeling better.

## 2014-01-09 NOTE — Patient Instructions (Signed)
Take Tamiflu, Zithromax and Hycodan as directed. Out of work until feeling better.

## 2014-02-16 NOTE — Progress Notes (Signed)
Subjective:    Patient ID: Taylor Moreno, female    DOB: 1956-03-05, 58 y.o.   MRN: [id number]  HPI 58 year old white female in today for health maintenance exam and evaluation of medical issues. Has a history of hypertension, hyperlipidemia, osteopenia and depression. Influenza vaccine given.  No known drug allergies. Fosamax causes GI symptoms. Refuses statin medication for hyperlipidemia.  Past medical history: Tonsillectomy as a teenager. History of right knee surgery for torn cartilage December 2002. Left knee surgery by Dr. Pollie Meyer  for torn cartilage October 2003. Fractured left wrist in the late 1980s occurred secondary to catching her arm on steering wheel resulting in some type of rotational injury.  Started Cozaar September 2011. Gets annual mammogram. Takes calcium and vitamin D supplement. Was started on Lipitor in 2008 but discontinued it and refuses to take it.  Colonoscopy done January 2013.  Social history: She is single. 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 Formerly smoked a half pack daily for some 10 years. Quit smoking around 1984. Has a college education. In the 1990s she had a boyfriend. Since then she has not seen anyone seriously. Social alcohol consumption. No children. Never married.  Family history: Parents are living but having issues with dementia. Mother is had stroke and is a nursing facility and this is been very stressful for patient. 2 sisters in good health.     Review of Systems  Constitutional: Positive for fatigue.  HENT: Negative.   Eyes: Negative.   Respiratory: Negative.   Cardiovascular: Negative.   Gastrointestinal: Negative.   Endocrine:       History of hyperlipidemia  Genitourinary: Negative.   Hematological: Negative.   Psychiatric/Behavioral:       Stress and anxiety due to aging parents and mother in nursing facility       Objective:   Physical Exam  Vitals reviewed. Constitutional: She appears well-developed and  well-nourished. No distress.  HENT:  Head: Normocephalic and atraumatic.  Right Ear: External ear normal.  Left Ear: External ear normal.  Mouth/Throat: Oropharynx is clear and moist. No oropharyngeal exudate.  Eyes: Conjunctivae and EOM are normal. Pupils are equal, round, and reactive to light. Right eye exhibits no discharge. Left eye exhibits no discharge. No scleral icterus.  Neck: Normal range of motion. Neck supple. No JVD present. No thyromegaly present.  Cardiovascular: Normal rate, regular rhythm and normal heart sounds.   No murmur heard. Pulmonary/Chest: Effort normal and breath sounds normal. No respiratory distress. She has no rales.  Breasts normal female  Abdominal: Soft. Bowel sounds are normal. She exhibits no distension and no mass. There is no tenderness. There is no rebound and no guarding.  Genitourinary:  Pap taken- bimanual normal  Musculoskeletal: Normal range of motion. She exhibits no edema.  Lymphadenopathy:    She has no cervical adenopathy.  Neurological: She is alert. She has normal reflexes. She displays normal reflexes. No cranial nerve deficit. Coordination normal.  Skin: Skin is warm and dry. No rash noted. She is not diaphoretic.  Psychiatric: She has a normal mood and affect. Her behavior is normal. Judgment and thought content normal.          Assessment & Plan:  Hypertension-stable on current regimen of losartan 100 mg daily  Anxiety depression related to aging parents-continue Lexapro  Hyperlipidemia-patient refuses statin medication. Needs to diet and exercise.  Osteopenia treated with calcium and vitamin D  Plan: Continue same medications above and return in 6 months for office visit, blood pressure  check, fasting lipid panel. She obtains lab work through [employer] where she is employed. I am not pleased with her lipid panel. Suggest she recheck this after trial of diet and exercise in January 2015.

## 2014-02-16 NOTE — Patient Instructions (Signed)
Please watch diet and exercise. Followup lipid panel January 2015. Otherwise return in 6 months

## 2014-04-03 ENCOUNTER — Ambulatory Visit: Payer: 59 | Admitting: Internal Medicine

## 2014-04-10 ENCOUNTER — Ambulatory Visit (INDEPENDENT_AMBULATORY_CARE_PROVIDER_SITE_OTHER): Payer: 59 | Admitting: Internal Medicine

## 2014-04-10 ENCOUNTER — Encounter: Payer: Self-pay | Admitting: Internal Medicine

## 2014-04-10 VITALS — BP 134/72 | HR 88 | Temp 99.8°F | Wt 175.0 lb

## 2014-04-10 DIAGNOSIS — E785 Hyperlipidemia, unspecified: Secondary | ICD-10-CM

## 2014-04-10 DIAGNOSIS — F439 Reaction to severe stress, unspecified: Secondary | ICD-10-CM

## 2014-04-10 DIAGNOSIS — Z8659 Personal history of other mental and behavioral disorders: Secondary | ICD-10-CM

## 2014-04-10 DIAGNOSIS — I10 Essential (primary) hypertension: Secondary | ICD-10-CM

## 2014-04-10 DIAGNOSIS — Z733 Stress, not elsewhere classified: Secondary | ICD-10-CM

## 2014-04-16 ENCOUNTER — Encounter: Payer: Self-pay | Admitting: Internal Medicine

## 2014-04-16 NOTE — Progress Notes (Signed)
   Subjective:    Patient ID: Taylor Moreno, female    DOB: 04-17-1956, 58 y.o.   MRN: [id number]  HPI  In today for six-month recheck on depression, hyperlipidemia and hypertension. Recent lab work January 20 15th at [facility] [employer] where she is employed. Blood pressure rechecked 134/72 after initial reading of 144/72. Father is been diagnosed with prostate cancer. Mother nursing home with history of stroke and dysrhythmia. Patient says blood pressure at home is running around 113/72. Temperature is elevated today but she does not know why. She does not feel ill. In January total cholesterol was 231, triglycerides 174, LDL cholesterol 145. Says she's trying to watch what she eats and is beginning to exercise once again. Remains on losartan 100 mg daily and Lexapro for depression.    Review of Systems     Objective:   Physical Exam Skin warm and dry. Nodes none. Chest clear to auscultation. Cardiac exam regular rate and rhythm. Extremities without edema. Affect normal.       Assessment & Plan:  Hypertension-stable  Depression-stable on Lexapro. Situation with parents is difficult.  Hyperlipidemia-continue diet and exercise efforts. She does not want to be on lipid-lowering medication  Plan: Return in 6 months for physical examination.

## 2014-04-16 NOTE — Patient Instructions (Signed)
Continue same medications. Continue diet and exercise efforts. Return in 6 months.

## 2014-07-06 ENCOUNTER — Other Ambulatory Visit: Payer: Self-pay | Admitting: Internal Medicine

## 2014-09-26 ENCOUNTER — Other Ambulatory Visit: Payer: Self-pay | Admitting: Internal Medicine

## 2014-09-26 DIAGNOSIS — Z0000 Encounter for general adult medical examination without abnormal findings: Secondary | ICD-10-CM

## 2014-09-26 DIAGNOSIS — Z1329 Encounter for screening for other suspected endocrine disorder: Secondary | ICD-10-CM

## 2014-09-26 DIAGNOSIS — E785 Hyperlipidemia, unspecified: Secondary | ICD-10-CM

## 2014-09-26 DIAGNOSIS — I10 Essential (primary) hypertension: Secondary | ICD-10-CM

## 2014-09-26 DIAGNOSIS — Z1321 Encounter for screening for nutritional disorder: Secondary | ICD-10-CM

## 2014-09-26 DIAGNOSIS — R7302 Impaired glucose tolerance (oral): Secondary | ICD-10-CM

## 2014-09-26 NOTE — Progress Notes (Signed)
Patient request lab orders be faxed to her so she can have them drawn at [facility] where she works.  Faxed to [phone]

## 2014-10-04 ENCOUNTER — Other Ambulatory Visit: Payer: Self-pay

## 2014-10-04 DIAGNOSIS — Z1231 Encounter for screening mammogram for malignant neoplasm of breast: Secondary | ICD-10-CM

## 2014-10-09 ENCOUNTER — Encounter: Payer: 59 | Admitting: Internal Medicine

## 2014-10-10 ENCOUNTER — Encounter: Payer: Self-pay | Admitting: Internal Medicine

## 2014-10-12 ENCOUNTER — Other Ambulatory Visit: Payer: Self-pay | Admitting: Internal Medicine

## 2014-10-15 ENCOUNTER — Other Ambulatory Visit: Payer: Self-pay | Admitting: Internal Medicine

## 2014-10-23 ENCOUNTER — Ambulatory Visit
Admission: RE | Admit: 2014-10-23 | Discharge: 2014-10-23 | Disposition: A | Discharge status: Home / Self Care | Payer: 59 | Source: Ambulatory Visit

## 2014-10-23 DIAGNOSIS — Z1231 Encounter for screening mammogram for malignant neoplasm of breast: Secondary | ICD-10-CM

## 2014-11-14 ENCOUNTER — Other Ambulatory Visit: Payer: Self-pay | Admitting: Internal Medicine

## 2014-12-04 ENCOUNTER — Ambulatory Visit (INDEPENDENT_AMBULATORY_CARE_PROVIDER_SITE_OTHER): Payer: 59 | Admitting: Internal Medicine

## 2014-12-04 ENCOUNTER — Telehealth: Payer: Self-pay

## 2014-12-04 ENCOUNTER — Other Ambulatory Visit: Payer: Self-pay | Admitting: Internal Medicine

## 2014-12-04 ENCOUNTER — Encounter: Payer: Self-pay | Admitting: Internal Medicine

## 2014-12-04 VITALS — BP 130/80 | HR 87 | Temp 99.5°F | Ht 62.0 in | Wt 177.0 lb

## 2014-12-04 DIAGNOSIS — Z23 Encounter for immunization: Secondary | ICD-10-CM

## 2014-12-04 DIAGNOSIS — R945 Abnormal results of liver function studies: Principal | ICD-10-CM

## 2014-12-04 DIAGNOSIS — I10 Essential (primary) hypertension: Secondary | ICD-10-CM

## 2014-12-04 DIAGNOSIS — Z87312 Personal history of (healed) stress fracture: Secondary | ICD-10-CM

## 2014-12-04 DIAGNOSIS — E785 Hyperlipidemia, unspecified: Secondary | ICD-10-CM | POA: Diagnosis not present

## 2014-12-04 DIAGNOSIS — F439 Reaction to severe stress, unspecified: Secondary | ICD-10-CM

## 2014-12-04 DIAGNOSIS — Z658 Other specified problems related to psychosocial circumstances: Secondary | ICD-10-CM

## 2014-12-04 DIAGNOSIS — Z8659 Personal history of other mental and behavioral disorders: Secondary | ICD-10-CM | POA: Diagnosis not present

## 2014-12-04 DIAGNOSIS — Z0000 Encounter for general adult medical examination without abnormal findings: Secondary | ICD-10-CM | POA: Diagnosis not present

## 2014-12-04 DIAGNOSIS — M8580 Other specified disorders of bone density and structure, unspecified site: Secondary | ICD-10-CM | POA: Diagnosis not present

## 2014-12-04 DIAGNOSIS — R7989 Other specified abnormal findings of blood chemistry: Secondary | ICD-10-CM

## 2014-12-04 LAB — POCT URINALYSIS DIPSTICK
Bilirubin, UA: NEGATIVE
GLUCOSE UA: NEGATIVE
Ketones, UA: NEGATIVE
Leukocytes, UA: NEGATIVE
Nitrite, UA: NEGATIVE
Protein, UA: NEGATIVE
RBC UA: NEGATIVE
Spec Grav, UA: 1.01
UROBILINOGEN UA: NEGATIVE
pH, UA: 6.5

## 2014-12-04 MED ORDER — ESCITALOPRAM OXALATE 10 MG PO TABS: ORAL_TABLET | ORAL | Status: DC

## 2014-12-04 MED ORDER — IBANDRONATE SODIUM 150 MG PO TABS: ORAL_TABLET | ORAL | Status: DC

## 2014-12-04 NOTE — Telephone Encounter (Signed)
Referral for ultrasound scheduled at [facility] for 12/13/14 at 8 am at [address]

## 2014-12-04 NOTE — Patient Instructions (Signed)
Continue Boniva monthly. Remind us to do bone density study next year.

## 2014-12-13 ENCOUNTER — Ambulatory Visit
Admission: RE | Admit: 2014-12-13 | Discharge: 2014-12-13 | Disposition: A | Discharge status: Home / Self Care | Payer: 59 | Source: Ambulatory Visit | Attending: Internal Medicine | Admitting: Internal Medicine

## 2014-12-13 ENCOUNTER — Telehealth: Payer: Self-pay | Admitting: *Deleted

## 2014-12-13 DIAGNOSIS — R945 Abnormal results of liver function studies: Principal | ICD-10-CM

## 2014-12-13 DIAGNOSIS — R7989 Other specified abnormal findings of blood chemistry: Secondary | ICD-10-CM

## 2014-12-13 NOTE — Telephone Encounter (Signed)
Reviewed instructions with patient .  

## 2014-12-31 NOTE — Progress Notes (Signed)
Subjective:    Patient ID: Taylor Moreno, female    DOB: 1956/12/04, 59 y.o.   MRN: [id number]  HPI 59 year old White Female in today for health maintenance exam and evaluation of medical issues. In July, she was seen Dr. Charlett BlakeVoytek  with a stress fracture of left tibia. She wore a boot until healed. She had bone density study in 2014 showing -2.4 T score in the left femoral neck and -0.7 in the LS-spine. We are going to start her on Boniva.  Hasn't really been able to exercise. Still has issues with elderly parents in caring for them.  No known drug allergies. Fosamax caused GI symptoms. Refuses statin medication for hyperlipidemia.  Past medical history: Tonsillectomy as a teenager. History of right knee surgery for torn cartilage December 2002. Left knee surgery with Dr. Brynda Greathouseandall for torn cartilage October 2003. Fractured left wrist in the 1980s occurred secondary to catching her arm on the steering wheel which resulted in some type of rotational injury.  Takes calcium and vitamin D supplement. Was started on Lipitor in 2008 but discontinued it and refuses to take it. Started Cozaar September 2011.  Colonoscopy January 2013  Social history: She is single. 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 Formerly smoked a half pack cigarettes daily for some 10 years. Quit smoking around 1984. Has college education. In the 1990s, she had a boyfriend. Since then has not seen anyone seriously. Social alcohol consumption. No children. Never married.  Family history: Mother has had stroke and is in a nursing facility. Father with dementia. 2 sisters in good health.    Review of Systems  Constitutional: Positive for fatigue.  Psychiatric/Behavioral:       Situational stress and depression with aging parents  All other systems reviewed and are negative.      Objective:   Physical Exam  Constitutional: She is oriented to person, place, and time. She appears well-developed and well-nourished. No  distress.  HENT:  Head: Normocephalic and atraumatic.  Right Ear: External ear normal.  Left Ear: External ear normal.  Mouth/Throat: Oropharynx is clear and moist. No oropharyngeal exudate.  Eyes: Conjunctivae and EOM are normal. Pupils are equal, round, and reactive to light. Right eye exhibits no discharge. Left eye exhibits no discharge. No scleral icterus.  Neck: Neck supple. No JVD present. No thyromegaly present.  Cardiovascular: Normal rate, regular rhythm and normal heart sounds.   No murmur heard. Pulmonary/Chest: Effort normal and breath sounds normal. No respiratory distress. She has no wheezes. She has no rales.  Breasts normal female  Abdominal: Soft. Bowel sounds are normal. She exhibits no distension and no mass. There is no tenderness. There is no rebound and no guarding.  Genitourinary:  Pap taken 2014. Bimanual normal  Musculoskeletal: Normal range of motion. She exhibits no edema.  Lymphadenopathy:    She has no cervical adenopathy.  Neurological: She is alert and oriented to person, place, and time. She has normal reflexes. Coordination normal.  Skin: Skin is warm and dry. No rash noted. She is not diaphoretic.  Psychiatric: She has a normal mood and affect. Her behavior is normal. Judgment and thought content normal.  Vitals reviewed.         Assessment & Plan:  History of depression maintained on SSRI  Hypertension-stable  Hyperlipidemia-refuses statin therapy  Osteoporosis-start Boniva and see if tolerates this. Previously could not tolerate Fosamax due to GI side effects  Plan: Return in 6 months or as needed.   Fasting lipid  panel shows total cholesterol 248 with an LDL cholesterol of 163. SGPT mildly elevated 56 likely due to fatty liver. TSH is normal. Hemoglobin A1c 5.5 despite elevated serum glucose of 106. These labs were done at her employment with [employer]

## 2015-04-16 ENCOUNTER — Encounter: Payer: Self-pay | Admitting: Podiatry

## 2015-04-16 ENCOUNTER — Ambulatory Visit (INDEPENDENT_AMBULATORY_CARE_PROVIDER_SITE_OTHER): Payer: 59 | Admitting: Podiatry

## 2015-04-16 ENCOUNTER — Ambulatory Visit (INDEPENDENT_AMBULATORY_CARE_PROVIDER_SITE_OTHER): Payer: 59

## 2015-04-16 VITALS — BP 138/79 | HR 85 | Resp 15

## 2015-04-16 DIAGNOSIS — M722 Plantar fascial fibromatosis: Secondary | ICD-10-CM

## 2015-04-16 DIAGNOSIS — M79672 Pain in left foot: Secondary | ICD-10-CM | POA: Diagnosis not present

## 2015-04-16 MED ORDER — TRIAMCINOLONE ACETONIDE 10 MG/ML IJ SUSP
10.0000 mg | Freq: Once | INTRAMUSCULAR | Status: AC
  Administered 2015-04-16: 10 mg

## 2015-04-16 NOTE — Progress Notes (Signed)
   Subjective:    Patient ID: Taylor Moreno, female    DOB: March 11, 1956, 59 y.o.   MRN: [id number]  HPI Pt presents with painful knots on the plantar aspect of her left foot, noticed them approx 3 weeks prior   Review of Systems  All other systems reviewed and are negative.      Objective:   Physical Exam        Assessment & Plan:

## 2015-04-19 NOTE — Progress Notes (Signed)
Subjective:     Patient ID: Taylor Moreno, female   DOB: 1956/03/01, 59 y.o.   MRN: [id number]  HPI patient presents stating these 2 small knot appeared on the bottom of my left arch and they have been painful. I've noticed them being painful for 3 weeks and I'm not sure if they were they're previous to that   Review of Systems  All other systems reviewed and are negative.      Objective:   Physical Exam  Constitutional: She is oriented to person, place, and time.  Cardiovascular: Intact distal pulses.   Musculoskeletal: Normal range of motion.  Neurological: She is oriented to person, place, and time.  Skin: Skin is warm.  Nursing note and vitals reviewed.  neurovascular status intact with muscle strength adequate and range of motion subtalar midtarsal joint within normal limits. 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 I did careful injection of the areas 3 mg Kenalog 5 mg Xylocaine to shrink the inflamed tissue and advised on heat and cold compresses. If symptoms were to persist they were to grow in size or change color we will need to consider excision of these lesions

## 2015-06-11 ENCOUNTER — Encounter: Payer: Self-pay | Admitting: Internal Medicine

## 2015-06-11 ENCOUNTER — Ambulatory Visit (INDEPENDENT_AMBULATORY_CARE_PROVIDER_SITE_OTHER): Payer: 59 | Admitting: Internal Medicine

## 2015-06-11 VITALS — BP 116/64 | HR 90 | Temp 98.0°F | Wt 176.0 lb

## 2015-06-11 DIAGNOSIS — I10 Essential (primary) hypertension: Secondary | ICD-10-CM

## 2015-06-11 DIAGNOSIS — E785 Hyperlipidemia, unspecified: Secondary | ICD-10-CM

## 2015-06-11 DIAGNOSIS — Z820 Family history of epilepsy and other diseases of the nervous system: Secondary | ICD-10-CM

## 2015-06-11 DIAGNOSIS — Z818 Family history of other mental and behavioral disorders: Secondary | ICD-10-CM

## 2015-06-11 MED ORDER — ROSUVASTATIN CALCIUM 5 MG PO TABS: 5.0000 mg | ORAL_TABLET | Freq: Every day | ORAL | Status: DC

## 2015-06-11 NOTE — Progress Notes (Signed)
   Subjective:    Patient ID: Taylor Moreno, female    DOB: 1956/09/05, 59 y.o.   MRN: [id number]  HPI Patient continues to have issues with hyperlipidemia. Total cholesterol was 257. She claims to have had side effects when she took statin medication in the remote past. However, her family history of dementia in father and stroke in her mother would I think encourage her to take statin medication once again. Triglycerides remain slightly elevated in the 160 range.  She also has a history of essential hypertension and blood pressures under excellent control on current regimen  Review of Systems noncontributory     Objective:   Physical Exam  Not examined. Reviewed lipid panel with her.      Assessment & Plan:  Hypercholesterolemia-total cholesterol 257, LDL cholesterol 171 Hypertriglyceridemia-triglycerides 167  Essential hypertension  Family history of stroke  Family history of dementia  Plan: Trial of Crestor 5 mg daily. Return in 3 months. We'll have lipid panel and liver functions drawn before office visit at [employer] where she is employed.

## 2015-06-11 NOTE — Patient Instructions (Signed)
Start Crestor 5 mg daily. Have lipid panel liver functions with office visit in 3 months

## 2015-09-10 ENCOUNTER — Ambulatory Visit: Payer: 59 | Admitting: Internal Medicine

## 2015-09-21 ENCOUNTER — Encounter: Payer: Self-pay | Admitting: Internal Medicine

## 2015-09-24 ENCOUNTER — Encounter: Payer: Self-pay | Admitting: Internal Medicine

## 2015-09-24 ENCOUNTER — Ambulatory Visit (INDEPENDENT_AMBULATORY_CARE_PROVIDER_SITE_OTHER): Payer: 59 | Admitting: Internal Medicine

## 2015-09-24 VITALS — BP 130/82 | HR 80 | Temp 98.5°F | Ht 62.0 in | Wt 177.5 lb

## 2015-09-24 DIAGNOSIS — Z23 Encounter for immunization: Secondary | ICD-10-CM

## 2015-09-24 DIAGNOSIS — I10 Essential (primary) hypertension: Secondary | ICD-10-CM

## 2015-09-24 DIAGNOSIS — F32A Depression, unspecified: Secondary | ICD-10-CM

## 2015-09-24 DIAGNOSIS — E785 Hyperlipidemia, unspecified: Secondary | ICD-10-CM

## 2015-09-24 DIAGNOSIS — F329 Major depressive disorder, single episode, unspecified: Secondary | ICD-10-CM

## 2015-09-24 DIAGNOSIS — R748 Abnormal levels of other serum enzymes: Secondary | ICD-10-CM

## 2015-09-24 NOTE — Progress Notes (Signed)
   Subjective:    Patient ID: Taylor Moreno, female    DOB: 25-Sep-1956, 59 y.o.   MRN: [id number]  HPI  Mother diagnosed with cerebral amyloid angiopathy.Has had another stroke. Father with dementia.  Did not start Crestor for hyperlipidemia. Walking some. Total cholesterol is 218 and was 257. Triglycerides are 186 and were about 160. Liver functions are normal except for 36. Normal is 32. Trying to eat healthier. To get flu vaccine. Despite stress with family, seems to be coping fairly well   Review of Systems     Objective:   Physical Exam  Spent 25 minutes speaking with patient about these issues. Since she has shown improvement with exercise, we will hold off on starting lipid medication. She really does not want to take statin medication.      Assessment & Plan:  Hyperlipidemia-continue diet and exercise efforts  Essential hypertension  History of elevated liver enzymes  Obesity  History of depression  Plan: Continue diet exercise efforts. Hold off on starting lipid-lowering medication. Physical exam scheduled for March 2017

## 2015-10-17 ENCOUNTER — Other Ambulatory Visit: Payer: Self-pay

## 2015-10-17 DIAGNOSIS — Z1231 Encounter for screening mammogram for malignant neoplasm of breast: Secondary | ICD-10-CM

## 2015-10-20 ENCOUNTER — Encounter: Payer: Self-pay | Admitting: Internal Medicine

## 2015-10-20 NOTE — Patient Instructions (Signed)
It was a pleasure to see today. Continue diet exercise and weight loss efforts. Return in March 2017.

## 2015-10-25 ENCOUNTER — Other Ambulatory Visit: Payer: Self-pay | Admitting: Internal Medicine

## 2015-11-03 ENCOUNTER — Other Ambulatory Visit: Payer: Self-pay | Admitting: Internal Medicine

## 2015-11-20 ENCOUNTER — Ambulatory Visit: Payer: Self-pay

## 2015-12-21 ENCOUNTER — Ambulatory Visit
Admission: RE | Admit: 2015-12-21 | Discharge: 2015-12-21 | Disposition: A | Discharge status: Home / Self Care | Payer: 59 | Source: Ambulatory Visit

## 2015-12-21 DIAGNOSIS — Z1231 Encounter for screening mammogram for malignant neoplasm of breast: Secondary | ICD-10-CM

## 2016-03-03 ENCOUNTER — Ambulatory Visit (INDEPENDENT_AMBULATORY_CARE_PROVIDER_SITE_OTHER): Payer: 59 | Admitting: Internal Medicine

## 2016-03-03 ENCOUNTER — Encounter: Payer: Self-pay | Admitting: Internal Medicine

## 2016-03-03 VITALS — BP 126/82 | HR 78 | Temp 98.2°F | Resp 18 | Ht 62.0 in | Wt 181.5 lb

## 2016-03-03 DIAGNOSIS — F32A Depression, unspecified: Secondary | ICD-10-CM

## 2016-03-03 DIAGNOSIS — I10 Essential (primary) hypertension: Secondary | ICD-10-CM | POA: Diagnosis not present

## 2016-03-03 DIAGNOSIS — E7800 Pure hypercholesterolemia, unspecified: Secondary | ICD-10-CM

## 2016-03-03 DIAGNOSIS — E669 Obesity, unspecified: Secondary | ICD-10-CM | POA: Diagnosis not present

## 2016-03-03 DIAGNOSIS — E785 Hyperlipidemia, unspecified: Secondary | ICD-10-CM

## 2016-03-03 DIAGNOSIS — F329 Major depressive disorder, single episode, unspecified: Secondary | ICD-10-CM

## 2016-03-03 NOTE — Progress Notes (Signed)
   Subjective:    Patient ID: Taylor Moreno, female    DOB: [birth year], 60 y.o.   MRN: [id number]  HPI Continues to look after elderly parents who are in assisted living facility. Mother had stroke 4 years ago. This is been wearing on patient in a few weeks ago she got depressed. She's cut back her visits to them and that is helped a great deal. Taking more time for herself. Father has dementia. He's getting confused with the thermostat and sometimes with his medications. He is on Eloquis. Patient continues to work at [employer]. She brings in lab work done at [employer]. she does not want to be on statin medication. We had originally down for physical exam today but she wants to wait until October. Has not had physical exam in over a year.  In September total cholesterol was 218, triglycerides 186, LDL cholesterol 130. Liver functions were normal with the exception of SGPT slightly elevated at 36 normal being up to 32. Lab work done recently shows total cholesterol of 231, triglycerides of 129 which are improved, LDL cholesterol of 156. TSH is normal at 1.870. Vitamin D is normal. Sodium and potassium are normal. BUN and creatinine ratio is normal.  She took flu vaccine earlier in the Fall but had a flulike illness in January that lasted 2 or 3 days with fever and respiratory congestion.    Review of Systems     Objective:   Physical Exam  No thyromegaly. Chest clear to auscultation. Cardiac exam regular rate and rhythm. Extremities without edema. Skin warm and dry.      Assessment & Plan:  Hyperlipidemia-does not want to be on statin medication  Essential hypertension-blood pressure stable at 126/82 on current regimen  Depression-stable on Lexapro-has refills  Plan: Agrees to physical exam in October 2017 and will have lab work at [employer] prior to that appointment. She will need fasting CBC with differential, C met, lipid panel, TSH and vitamin D level prior to her physical

## 2016-03-03 NOTE — Patient Instructions (Signed)
Continue to work on diet and exercise. Return in 6 months for physical exam. Continue same medications.

## 2016-03-17 NOTE — Patient Instructions (Addendum)
See duplicate encounter in Epic same day

## 2016-03-20 ENCOUNTER — Encounter: Payer: Self-pay | Admitting: Internal Medicine

## 2016-03-20 NOTE — Progress Notes (Signed)
   Subjective:    Patient ID: Taylor Moreno, female    DOB: Nov 09, 1956, 60 y.o.   MRN: [id number]  HPI see note in EPIC same day. This is a duplicate encounter    Review of Systems     Objective:   Physical Exam        Assessment & Plan:  Duplicate encounter

## 2016-04-09 ENCOUNTER — Other Ambulatory Visit: Payer: Self-pay | Admitting: Internal Medicine

## 2016-09-12 ENCOUNTER — Other Ambulatory Visit: Payer: 59 | Admitting: Internal Medicine

## 2016-09-22 ENCOUNTER — Ambulatory Visit (INDEPENDENT_AMBULATORY_CARE_PROVIDER_SITE_OTHER): Payer: 59 | Admitting: Internal Medicine

## 2016-09-22 ENCOUNTER — Encounter: Payer: Self-pay | Admitting: Internal Medicine

## 2016-09-22 ENCOUNTER — Other Ambulatory Visit (HOSPITAL_COMMUNITY)
Admission: RE | Admit: 2016-09-22 | Discharge: 2016-09-22 | Disposition: A | Discharge status: Home / Self Care | Payer: 59 | Source: Ambulatory Visit | Attending: Internal Medicine | Admitting: Internal Medicine

## 2016-09-22 VITALS — BP 116/82 | HR 85 | Temp 98.9°F | Ht 62.0 in | Wt 150.0 lb

## 2016-09-22 DIAGNOSIS — E6609 Other obesity due to excess calories: Secondary | ICD-10-CM

## 2016-09-22 DIAGNOSIS — Z8659 Personal history of other mental and behavioral disorders: Secondary | ICD-10-CM | POA: Diagnosis not present

## 2016-09-22 DIAGNOSIS — Z01419 Encounter for gynecological examination (general) (routine) without abnormal findings: Secondary | ICD-10-CM | POA: Diagnosis present

## 2016-09-22 DIAGNOSIS — E7800 Pure hypercholesterolemia, unspecified: Secondary | ICD-10-CM | POA: Diagnosis not present

## 2016-09-22 DIAGNOSIS — Z0000 Encounter for general adult medical examination without abnormal findings: Secondary | ICD-10-CM

## 2016-09-22 DIAGNOSIS — F439 Reaction to severe stress, unspecified: Secondary | ICD-10-CM | POA: Diagnosis not present

## 2016-09-22 DIAGNOSIS — I10 Essential (primary) hypertension: Secondary | ICD-10-CM

## 2016-09-22 LAB — POCT URINALYSIS DIPSTICK
BILIRUBIN UA: NEGATIVE
Glucose, UA: NEGATIVE
Ketones, UA: NEGATIVE
Leukocytes, UA: NEGATIVE
NITRITE UA: NEGATIVE
Protein, UA: NEGATIVE
RBC UA: NEGATIVE
Spec Grav, UA: <=1.005
UROBILINOGEN UA: NEGATIVE
pH, UA: 5

## 2016-09-24 LAB — CYTOLOGY - PAP

## 2016-10-08 ENCOUNTER — Other Ambulatory Visit: Payer: Self-pay | Admitting: Internal Medicine

## 2016-10-18 NOTE — Patient Instructions (Signed)
Please continue diet exercise and weight loss efforts. Please consider statin therapy. Follow-up in 6 months. Continue same medications.

## 2016-10-18 NOTE — Progress Notes (Signed)
Subjective:    Patient ID: Taylor Moreno, female    DOB: [birth year], 60 y.o.   MRN: [id number]  HPI 60 year old female in today for health maintenance exam and evaluation of medical issues. History of osteopenia treated with Boniva after developing a stress fracture of left tibia in 2015. She had a bone density study in 2014 showing a T score in the left femoral neck of -2.4 and -0.7 and LS-spine. She has a history of hypertension and hyperlipidemia.  No known drug allergies. Intolerant of Fosamax-cause GI symptoms.  Has refused statin medication for hyperlipidemia.  Her mother passed away recently. She had history of stroke and was living in a nursing facility. Father still living in assisted living.  Past medical history: Tonsillectomy as a teenager. Right knee surgery for torn cartilage December 2002. Left knee surgery for torn cartilage October 2003. Fractured left wrist in the 1980s which occurred secondary to catching her arm on the steering wheel which resulted in some type of rotational injury.  Started on Cozaar September 2011. Tried Lipitor briefly in 2008 but she declined to take it.  Colonoscopy January 2013  Social history: She is single. 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 Formerly smoked a half pack cigarettes daily for some 10 years but quit smoking around 1984. Has college education. Social alcohol consumption. No children. Never married.  Family history: Father with history of dementia. 2 sisters in good health. Mother deceased with complications of stroke    Review of Systems  Constitutional: Positive for fatigue.  Psychiatric/Behavioral:       Situational stress with father       Objective:   Physical Exam  Constitutional: She is oriented to person, place, and time. She appears well-developed and well-nourished. No distress.  HENT:  Head: Normocephalic and atraumatic.  Right Ear: External ear normal.  Left Ear: External ear normal.  Mouth/Throat:  Oropharynx is clear and moist.  Eyes: Conjunctivae and EOM are normal. Pupils are equal, round, and reactive to light. Right eye exhibits no discharge. Left eye exhibits no discharge. No scleral icterus.  Neck: Neck supple. No JVD present. No thyromegaly present.  Cardiovascular: Normal rate, regular rhythm and normal heart sounds.   No murmur heard. Pulmonary/Chest: No respiratory distress. She has no wheezes.  Breasts normal female without masses  Abdominal: Soft. Bowel sounds are normal. She exhibits no distension and no mass. There is no tenderness. There is no rebound and no guarding.  Genitourinary:  Genitourinary Comments: Pap taken. Bimanual normal  Lymphadenopathy:    She has no cervical adenopathy.  Neurological: She is alert and oriented to person, place, and time. She has normal reflexes. No cranial nerve deficit. Coordination normal.  Skin: Skin is warm and dry. No rash noted. She is not diaphoretic.  Psychiatric: She has a normal mood and affect. Her behavior is normal. Judgment and thought content normal.  Vitals reviewed.         Assessment & Plan:  Essential hypertension  Hyperlipidemia-does not want to be on statin therapy. Continue diet and exercise. Total cholesterol 275 with LDL of 203.  Obesity- Has lost a significant amount of weight over the past few months with a special diet. BMI has decreased from 30-27 in the past 2 years but mostly in the past few months.  Situational stress with father in nursing facility  Osteopenia-continue Boniva. Needs bone density study in the near future  History of depression maintained on SSRI  Plan: Return in 6 months and  follow-up with hyperlipidemia that time as well as blood pressure

## 2016-11-10 ENCOUNTER — Other Ambulatory Visit: Payer: Self-pay | Admitting: Internal Medicine

## 2016-11-10 DIAGNOSIS — Z1231 Encounter for screening mammogram for malignant neoplasm of breast: Secondary | ICD-10-CM

## 2016-11-16 ENCOUNTER — Other Ambulatory Visit: Payer: Self-pay | Admitting: Internal Medicine

## 2016-12-24 ENCOUNTER — Ambulatory Visit
Admission: RE | Admit: 2016-12-24 | Discharge: 2016-12-24 | Disposition: A | Discharge status: Home / Self Care | Payer: 59 | Source: Ambulatory Visit | Attending: Internal Medicine | Admitting: Internal Medicine

## 2016-12-24 DIAGNOSIS — Z1231 Encounter for screening mammogram for malignant neoplasm of breast: Secondary | ICD-10-CM | POA: Diagnosis not present

## 2017-03-27 DIAGNOSIS — Z7689 Persons encountering health services in other specified circumstances: Secondary | ICD-10-CM | POA: Diagnosis not present

## 2017-03-30 ENCOUNTER — Encounter: Payer: Self-pay | Admitting: Internal Medicine

## 2017-03-30 ENCOUNTER — Ambulatory Visit (INDEPENDENT_AMBULATORY_CARE_PROVIDER_SITE_OTHER): Payer: 59 | Admitting: Internal Medicine

## 2017-03-30 VITALS — BP 128/60 | HR 82 | Temp 99.2°F | Ht 62.0 in | Wt 157.0 lb

## 2017-03-30 DIAGNOSIS — E7800 Pure hypercholesterolemia, unspecified: Secondary | ICD-10-CM | POA: Diagnosis not present

## 2017-03-30 DIAGNOSIS — Z8659 Personal history of other mental and behavioral disorders: Secondary | ICD-10-CM | POA: Diagnosis not present

## 2017-03-30 DIAGNOSIS — F439 Reaction to severe stress, unspecified: Secondary | ICD-10-CM | POA: Diagnosis not present

## 2017-03-30 DIAGNOSIS — I10 Essential (primary) hypertension: Secondary | ICD-10-CM

## 2017-03-30 DIAGNOSIS — Z6828 Body mass index (BMI) 28.0-28.9, adult: Secondary | ICD-10-CM

## 2017-03-30 MED ORDER — VALACYCLOVIR HCL 500 MG PO TABS: ORAL_TABLET | ORAL | 3 refills | Status: DC

## 2017-03-30 NOTE — Progress Notes (Signed)
   Subjective:    Patient ID: Taylor Moreno, female    DOB: 12-20-1956, 61 y.o.   MRN: [id number]  HPI   61 year old Female for six-month follow-up. She has a history of hypertension and hyperlipidemia. History of depression and osteopenia.  Refuses statin medication for hyperlipidemia  Father has had to move from assisted living to more skilled level of care. He has a history of dementia. Continues to be stressful for patient.    Review of Systems see above     Objective:   Physical Exam  Skin warm and dry. Nodes none. Neck supple. Chest clear. Cardiac exam regular rate and rhythm. Extremities without edema.      Assessment & Plan:  Essential hypertension-stable at 128/60.  BMI 28.72-continue to work on diet exercise and weight loss. Not motivated as much recently with stress with father.  Osteopenia-continue Boniva  Hyperlipidemia-refuses statin  Plan: Due for physical examination in 6 months.

## 2017-04-19 ENCOUNTER — Encounter: Payer: Self-pay | Admitting: Internal Medicine

## 2017-04-19 NOTE — Patient Instructions (Addendum)
It was pleasure to see today. Continue to work on diet exercise and weight loss. Continue same medications and return in 6 months.

## 2017-04-22 DIAGNOSIS — M722 Plantar fascial fibromatosis: Secondary | ICD-10-CM | POA: Diagnosis not present

## 2017-04-23 ENCOUNTER — Other Ambulatory Visit: Payer: Self-pay | Admitting: Internal Medicine

## 2017-09-10 ENCOUNTER — Other Ambulatory Visit: Payer: Self-pay | Admitting: Internal Medicine

## 2017-09-28 ENCOUNTER — Encounter: Payer: 59 | Admitting: Internal Medicine

## 2017-10-16 DIAGNOSIS — I10 Essential (primary) hypertension: Secondary | ICD-10-CM | POA: Diagnosis not present

## 2017-10-16 DIAGNOSIS — M8580 Other specified disorders of bone density and structure, unspecified site: Secondary | ICD-10-CM | POA: Diagnosis not present

## 2017-10-16 DIAGNOSIS — Z0000 Encounter for general adult medical examination without abnormal findings: Secondary | ICD-10-CM | POA: Diagnosis not present

## 2017-10-19 ENCOUNTER — Ambulatory Visit (INDEPENDENT_AMBULATORY_CARE_PROVIDER_SITE_OTHER): Payer: 59 | Admitting: Internal Medicine

## 2017-10-19 ENCOUNTER — Encounter: Payer: Self-pay | Admitting: Internal Medicine

## 2017-10-19 VITALS — BP 124/70 | HR 84 | Temp 99.3°F | Ht 62.0 in | Wt 161.0 lb

## 2017-10-19 DIAGNOSIS — Z6829 Body mass index (BMI) 29.0-29.9, adult: Secondary | ICD-10-CM

## 2017-10-19 DIAGNOSIS — F329 Major depressive disorder, single episode, unspecified: Secondary | ICD-10-CM

## 2017-10-19 DIAGNOSIS — F32A Depression, unspecified: Secondary | ICD-10-CM

## 2017-10-19 DIAGNOSIS — J22 Unspecified acute lower respiratory infection: Secondary | ICD-10-CM | POA: Diagnosis not present

## 2017-10-19 DIAGNOSIS — Z23 Encounter for immunization: Secondary | ICD-10-CM | POA: Diagnosis not present

## 2017-10-19 DIAGNOSIS — Z0000 Encounter for general adult medical examination without abnormal findings: Secondary | ICD-10-CM | POA: Diagnosis not present

## 2017-10-19 DIAGNOSIS — E7800 Pure hypercholesterolemia, unspecified: Secondary | ICD-10-CM | POA: Diagnosis not present

## 2017-10-19 DIAGNOSIS — F4321 Adjustment disorder with depressed mood: Secondary | ICD-10-CM | POA: Diagnosis not present

## 2017-10-19 DIAGNOSIS — I10 Essential (primary) hypertension: Secondary | ICD-10-CM | POA: Diagnosis not present

## 2017-10-19 DIAGNOSIS — B009 Herpesviral infection, unspecified: Secondary | ICD-10-CM | POA: Diagnosis not present

## 2017-10-19 LAB — POCT URINALYSIS DIPSTICK
Bilirubin, UA: NEGATIVE
Glucose, UA: NEGATIVE
Ketones, UA: NEGATIVE
Leukocytes, UA: NEGATIVE
NITRITE UA: NEGATIVE
PH UA: 6.5 (ref 5.0–8.0)
PROTEIN UA: NEGATIVE
RBC UA: NEGATIVE
SPEC GRAV UA: 1.02 (ref 1.010–1.025)
Urobilinogen, UA: 0.2 E.U./dL

## 2017-10-19 MED ORDER — AZITHROMYCIN 250 MG PO TABS: ORAL_TABLET | ORAL | 0 refills | Status: DC

## 2017-10-19 MED ORDER — VALACYCLOVIR HCL 500 MG PO TABS: ORAL_TABLET | ORAL | 3 refills | Status: DC

## 2017-10-19 NOTE — Patient Instructions (Signed)
We are sorry to hear about the loss of your father.  Continue same medications.  Work on diet exercise and weight loss and return in 6 months.

## 2017-10-19 NOTE — Progress Notes (Signed)
Subjective:    Patient ID: Taylor Moreno, female    DOB: October 20, 1956, 61 y.o.   MRN: [id number]  HPI 61 year old Female for health maintenance exam and evaluation of medical issues.  Sept 2017 -Total  Cholesterol was 275 and LDL 203.  She does not want to be on cholesterol-lowering medicine.  Not much change in total lipid panel findings.  Her father died several weeks ago.  He was in memory care and health began to decline.  Hospice was called in and he passed away peacefully.  Patient continues to go to work daily but has not felt like doing much around her house.  She spent the last 5 years of her life looking after both parents.  She has history of osteopenia treated with Boniva after developing a stress fracture of the left tibia in 2015.  Bone density study in 2014 showed T score in the left femoral neck of -2.4 and -0.7 in the LS spine.  She has a history of hypertension and hyperlipidemia.  No known drug allergies  She is intolerant of Fosamax it causes GI symptoms.  Mother passed away from stroke after living for several years in   Assisted living with her husband.  Patient's father had dementia.  Patient has 2 sisters living one with history of colon polyps.  Past medical history: Tonsillectomy as a teenager.  Right knee surgery for torn cartilage December 2002.  Left knee surgery for torn cartilage October 2003.  Fractured left wrist in the 1980s which occurred secondary to catching her arm on the steering wheel which resulted in some type of rotational injury.  Started on Cozaar September 2011.  Tried Lipitor briefly in 2008 but she declined to take it.   Review of Systems  Constitutional: Negative.   Respiratory: Negative.   Cardiovascular: Negative.   Gastrointestinal: Negative.   Genitourinary: Negative.   Neurological:       Recently had episode of seeing a halo which she thinks may have been an ophthalmic migraine.  It lasted for some 10 or 15 minutes and was not  associated with headache.       Objective:   Physical Exam  Constitutional: She is oriented to person, place, and time. She appears well-developed and well-nourished. No distress.  HENT:  Head: Normocephalic and atraumatic.  Right Ear: External ear normal.  Left Ear: External ear normal.  Mouth/Throat: Oropharynx is clear and moist.  Eyes: Pupils are equal, round, and reactive to light. Conjunctivae and EOM are normal. Right eye exhibits no discharge. Left eye exhibits no discharge. No scleral icterus.  Neck: Neck supple. No JVD present. No tracheal deviation present. No thyromegaly present.  Cardiovascular: Normal rate, regular rhythm, normal heart sounds and intact distal pulses.   No murmur heard. Pulmonary/Chest: Effort normal and breath sounds normal. No respiratory distress. She has no wheezes. She has no rales. She exhibits no tenderness.  Abdominal: Soft. Bowel sounds are normal. She exhibits no distension and no mass. There is no tenderness. There is no rebound and no guarding.  Genitourinary:  Genitourinary Comments: Pap taken 2017.  Bimanual normal.  Musculoskeletal: She exhibits no edema.  Lymphadenopathy:    She has no cervical adenopathy.  Neurological: She is alert and oriented to person, place, and time. She has normal reflexes. No cranial nerve deficit. Coordination normal.  Skin: Skin is warm and dry. No rash noted. She is not diaphoretic. No erythema.  Psychiatric: She has a normal mood and affect. Her  behavior is normal. Judgment and thought content normal.  Vitals reviewed.         Assessment & Plan:  Hyperlipidemia-continue to work on diet and exercise.  She still grieving the loss of father.  She does plan to improve with diet and exercise in the next few months and we will follow-up on lipid panel in 6 months  History of depression continue Lexapro 10 mg daily  She was cooking with some chili peppers and inhaled some of the pepper of dust and has been  coughing for at least a month.  Likely has some bronchitis and will be treated with Zithromax Z-Pak.  History of herpes simplex-Valtrex refilled  Health maintenance-flu vaccine given  Essential hypertension  Tinnitus patient is having some issues with tinnitus and thinks she may be losing her hearing.  However hearing was normal today at 40 dB with Audioscope II device

## 2018-01-01 ENCOUNTER — Other Ambulatory Visit: Payer: Self-pay | Admitting: Internal Medicine

## 2018-01-01 DIAGNOSIS — Z139 Encounter for screening, unspecified: Secondary | ICD-10-CM

## 2018-02-01 ENCOUNTER — Ambulatory Visit
Admission: RE | Admit: 2018-02-01 | Discharge: 2018-02-01 | Disposition: A | Discharge status: Home / Self Care | Payer: 59 | Source: Ambulatory Visit | Attending: Internal Medicine | Admitting: Internal Medicine

## 2018-02-01 DIAGNOSIS — Z139 Encounter for screening, unspecified: Secondary | ICD-10-CM

## 2018-02-01 DIAGNOSIS — Z1231 Encounter for screening mammogram for malignant neoplasm of breast: Secondary | ICD-10-CM | POA: Diagnosis not present

## 2018-02-16 ENCOUNTER — Other Ambulatory Visit: Payer: Self-pay | Admitting: Internal Medicine

## 2018-02-17 ENCOUNTER — Other Ambulatory Visit: Payer: Self-pay | Admitting: Internal Medicine

## 2018-03-18 ENCOUNTER — Encounter: Payer: Self-pay | Admitting: Internal Medicine

## 2018-03-18 DIAGNOSIS — E7800 Pure hypercholesterolemia, unspecified: Secondary | ICD-10-CM | POA: Diagnosis not present

## 2018-03-22 ENCOUNTER — Encounter: Payer: Self-pay | Admitting: Internal Medicine

## 2018-03-22 ENCOUNTER — Ambulatory Visit (INDEPENDENT_AMBULATORY_CARE_PROVIDER_SITE_OTHER): Payer: 59 | Admitting: Internal Medicine

## 2018-03-22 VITALS — BP 128/70 | HR 72 | Ht 62.0 in

## 2018-03-22 DIAGNOSIS — F32A Depression, unspecified: Secondary | ICD-10-CM

## 2018-03-22 DIAGNOSIS — E7800 Pure hypercholesterolemia, unspecified: Secondary | ICD-10-CM | POA: Diagnosis not present

## 2018-03-22 DIAGNOSIS — F329 Major depressive disorder, single episode, unspecified: Secondary | ICD-10-CM | POA: Diagnosis not present

## 2018-03-22 DIAGNOSIS — Z6829 Body mass index (BMI) 29.0-29.9, adult: Secondary | ICD-10-CM

## 2018-04-07 ENCOUNTER — Encounter: Payer: Self-pay | Admitting: Internal Medicine

## 2018-04-07 ENCOUNTER — Ambulatory Visit: Payer: 59 | Admitting: Internal Medicine

## 2018-04-07 ENCOUNTER — Telehealth: Payer: Self-pay | Admitting: Internal Medicine

## 2018-04-07 VITALS — BP 110/70 | HR 66 | Temp 99.1°F | Ht 62.0 in | Wt 162.0 lb

## 2018-04-07 DIAGNOSIS — J22 Unspecified acute lower respiratory infection: Secondary | ICD-10-CM

## 2018-04-07 MED ORDER — AZITHROMYCIN 250 MG PO TABS: ORAL_TABLET | ORAL | 0 refills | Status: DC

## 2018-04-07 MED ORDER — HYDROCODONE-HOMATROPINE 5-1.5 MG/5ML PO SYRP: 5.0000 mL | ORAL_SOLUTION | Freq: Three times a day (TID) | ORAL | 0 refills | Status: DC | PRN

## 2018-04-07 NOTE — Telephone Encounter (Signed)
Pt coming in this afternoon at 4:00

## 2018-04-07 NOTE — Telephone Encounter (Signed)
Taylor MinorsSharon Moreno Self [phone]  Taylor DecemberSharon called to see if she could be seen today or tomorrow, she has gotten a chest cold X1week, her cough is continually getting worse.

## 2018-04-07 NOTE — Telephone Encounter (Signed)
Can she come at 3:30 today?

## 2018-04-18 ENCOUNTER — Encounter: Payer: Self-pay | Admitting: Internal Medicine

## 2018-04-18 NOTE — Patient Instructions (Signed)
Take Zithromax Z-PAK 2 p.o. day 1 followed by 1 p.o. days 2 through 5.  Hycodan 1 teaspoon p.o. every 8 hours as needed cough.  Rest and drink plenty of fluids.

## 2018-04-18 NOTE — Patient Instructions (Signed)
Please try to get exercise and watch diet.  Follow-up in 6 months.  Zetia may be an option if you do not want to do statin medication.  We can discuss at that time.  Continue current medications.

## 2018-04-18 NOTE — Progress Notes (Signed)
   Subjective:    Patient ID: Taylor Moreno, female    DOB: [birth year], 62 y.o.   MRN: [id number]  HPI Patient in today with cough and congestion.  Low-grade fever.  Has malaise and fatigue.  Does not think she has the flu.  Little sputum production with cough.    Review of Systems see above     Objective:   Physical Exam Pharynx is clear.  TMs are clear.  Neck is supple.  Chest clear to auscultation.  She looks tired.       Assessment & Plan:  Acute lower respiratory infection  Plan: Zithromax Z-Pak take 2 tablets day 1 followed by 1 tablet days 2 through 5.  Hycodan 1 teaspoon p.o. every 8 hours as needed cough.  Rest and drink plenty of fluids.

## 2018-04-18 NOTE — Progress Notes (Signed)
   Subjective:    Patient ID: Taylor Moreno, female    DOB: Dec 28, 1955, 62 y.o.   MRN: [id number]  HPI She is here today for 78-month follow-up.  She has history of depression treated with Lexapro.  Takes Boniva for osteopenia.  History of hyperlipidemia but has not wanted to be on statin medication.  Parents are deceased.  She is beginning to have some time for herself.  Continues work at [employer].  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  Total cholesterol was 287, triglycerides 82, HDL cholesterol 96 and LDL cholesterol 175.  Needs to diet and exercise.  Will not consider statin medication.  Zetia might be an option.  Review of Systems no new complaints     Objective:   Physical Exam Her blood pressure is normal at 128/70.  Chest clear.  Cardiac exam regular rate and rhythm.  Extremities without edema.  BMI is 29.45         Assessment & Plan:  Pure hypercholesterolemia  History of anxiety depression and situational stress with parents  Plan: Would like to see her regain enthusiasm for diet exercise and weight loss which she had a couple of years ago.  Follow-up in 6 months.

## 2018-04-28 ENCOUNTER — Other Ambulatory Visit: Payer: Self-pay | Admitting: Internal Medicine

## 2018-06-08 ENCOUNTER — Other Ambulatory Visit: Payer: Self-pay | Admitting: Internal Medicine

## 2018-07-09 ENCOUNTER — Other Ambulatory Visit: Payer: Self-pay | Admitting: Internal Medicine

## 2018-08-19 ENCOUNTER — Other Ambulatory Visit: Payer: Self-pay | Admitting: Internal Medicine

## 2018-09-03 ENCOUNTER — Other Ambulatory Visit: Payer: Self-pay | Admitting: Internal Medicine

## 2018-10-19 ENCOUNTER — Encounter: Payer: Self-pay | Admitting: Internal Medicine

## 2018-10-19 DIAGNOSIS — I10 Essential (primary) hypertension: Secondary | ICD-10-CM | POA: Diagnosis not present

## 2018-10-19 DIAGNOSIS — M8580 Other specified disorders of bone density and structure, unspecified site: Secondary | ICD-10-CM | POA: Diagnosis not present

## 2018-10-19 DIAGNOSIS — E7800 Pure hypercholesterolemia, unspecified: Secondary | ICD-10-CM | POA: Diagnosis not present

## 2018-10-25 ENCOUNTER — Ambulatory Visit (INDEPENDENT_AMBULATORY_CARE_PROVIDER_SITE_OTHER): Payer: 59 | Admitting: Internal Medicine

## 2018-10-25 ENCOUNTER — Encounter: Payer: Self-pay | Admitting: Internal Medicine

## 2018-10-25 VITALS — BP 120/80 | HR 86 | Ht 62.0 in | Wt 163.0 lb

## 2018-10-25 DIAGNOSIS — E7800 Pure hypercholesterolemia, unspecified: Secondary | ICD-10-CM

## 2018-10-25 DIAGNOSIS — M8580 Other specified disorders of bone density and structure, unspecified site: Secondary | ICD-10-CM | POA: Diagnosis not present

## 2018-10-25 DIAGNOSIS — I10 Essential (primary) hypertension: Secondary | ICD-10-CM | POA: Diagnosis not present

## 2018-10-25 DIAGNOSIS — Z0000 Encounter for general adult medical examination without abnormal findings: Secondary | ICD-10-CM

## 2018-10-25 DIAGNOSIS — B009 Herpesviral infection, unspecified: Secondary | ICD-10-CM

## 2018-10-25 DIAGNOSIS — F329 Major depressive disorder, single episode, unspecified: Secondary | ICD-10-CM | POA: Diagnosis not present

## 2018-10-25 DIAGNOSIS — Z23 Encounter for immunization: Secondary | ICD-10-CM

## 2018-10-25 DIAGNOSIS — F32A Depression, unspecified: Secondary | ICD-10-CM

## 2018-10-25 DIAGNOSIS — Z6829 Body mass index (BMI) 29.0-29.9, adult: Secondary | ICD-10-CM

## 2018-10-25 LAB — POCT URINALYSIS DIPSTICK
APPEARANCE: NORMAL
Bilirubin, UA: NEGATIVE
Glucose, UA: NEGATIVE
Ketones, UA: NEGATIVE
LEUKOCYTES UA: NEGATIVE
Nitrite, UA: NEGATIVE
ODOR: NORMAL
PH UA: 6 (ref 5.0–8.0)
Protein, UA: NEGATIVE
RBC UA: NEGATIVE
Spec Grav, UA: 1.01 (ref 1.010–1.025)
UROBILINOGEN UA: 0.2 U/dL

## 2018-10-25 NOTE — Patient Instructions (Addendum)
Flu vaccine given. Wait on tetanus immunization. Watch diet. Walking for exercise.  Follow-up in 6 months.  Patient does not want to be on statin medication.  Bone density study ordered.

## 2018-10-25 NOTE — Progress Notes (Signed)
Subjective:    Patient ID: Taylor Moreno, female    DOB: 11/14/1956, 62 y.o.   MRN: [id number]  HPI 62 year old Female for health maintenance exam and evaluation  of medical issues.  Off Boniva about a year. Hx of osteopenia.  Boniva was started after developing a stress fracture of the left tibia in 2015.  Bone density study in 2014 showed a T score in the left femoral neck of -2.4 and -0.07 in the LS spine.  Repeat bone density study ordered with this visit.  History of depression treated with SSRI.    History of essential hypertension treated with Cozaar.    History of hyperlipidemia but has not wanted to be on statin medication.  Had normal Pap in 2017.  Flu vaccine given with this visit.  She brings in labs done through her employment at [employer].  Fasting glucose is normal.  BUN and creatinine are normal.  CBC is normal.  Total cholesterol remains high at 251 with an LDL cholesterol of 157.  TSH is normal at 2.520.  Had normal mammogram in February 2019.  Her vitamin D level is high at 106.  She needs to decrease vitamin D supplement from 8000 units daily to 4000 units daily.  It should be repeated in a couple of months.  Social history: Her parents are deceased.  Father died of complications of dementia.  Mother died of complications from a stroke.  The past couple of years before their deaths were hard for her.  It was stressful to watch and go downhill.  She seems to be doing better mentally and starting to do some things for herself.  She is single.  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  Formally smoked 1/2 pack of cigarettes daily for some 10 years but quit smoking in 1984.  Has a college degree.  Social alcohol consumption.  No children.  Never married.  Family history: 2 sisters in good health.  Mother died of complications of stroke.  Father died with complications of dementia.  Colonoscopy January 2013.  Started on Cozaar September 2011.  Tried Lipitor briefly  in 2008 but declined to take it long-term.    Review of Systems see above     Objective:   Physical Exam  Constitutional: She appears well-developed and well-nourished. No distress.  HENT:  Head: Normocephalic and atraumatic.  Right Ear: External ear normal.  Left Ear: External ear normal.  Mouth/Throat: Oropharynx is clear and moist. No oropharyngeal exudate.  Eyes: Pupils are equal, round, and reactive to light. Conjunctivae and EOM are normal. Right eye exhibits no discharge. Left eye exhibits no discharge. No scleral icterus.  Neck: Neck supple. No JVD present. No thyromegaly present.  Cardiovascular: Normal rate, regular rhythm and normal heart sounds.  No murmur heard. Pulmonary/Chest: Effort normal. No stridor. No respiratory distress. She has no wheezes.  Abdominal: Soft. She exhibits no distension and no mass. There is no tenderness. There is no guarding.  Genitourinary:  Genitourinary Comments: Pap deferred until 2020.  Musculoskeletal: She exhibits no edema.  Lymphadenopathy:    She has no cervical adenopathy.  Neurological: She displays normal reflexes. No cranial nerve deficit. She exhibits normal muscle tone. Coordination normal.  Skin: Skin is warm and dry. No rash noted. She is not diaphoretic.  Psychiatric: She has a normal mood and affect. Her behavior is normal. Judgment and thought content normal.  Vitals reviewed.         Assessment & Plan:  Essential hypertension-she currently is not on antihypertensive medication.  Continue to monitor.  Blood pressure stable today  History of dry eye syndrome treated with Restasis.  History of recurrent Herpes simplex type I treated with Valtrex.  Depression-not on antidepressant medication-used to take Lexapro  Osteopenia-has not taken Boniva in about a year.  Order written for bone density study.  Hyperlipidemia-has not wanted to be on statin medication.  BMI 29.81-encourage diet exercise and weight  loss  Health maintenance-flu vaccine given.  Get tetanus immunization at a later time

## 2018-11-27 ENCOUNTER — Other Ambulatory Visit: Payer: Self-pay | Admitting: Internal Medicine

## 2018-12-06 DIAGNOSIS — H2513 Age-related nuclear cataract, bilateral: Secondary | ICD-10-CM | POA: Diagnosis not present

## 2018-12-09 ENCOUNTER — Other Ambulatory Visit: Payer: Self-pay | Admitting: Internal Medicine

## 2018-12-20 ENCOUNTER — Other Ambulatory Visit: Payer: Self-pay | Admitting: Internal Medicine

## 2018-12-20 DIAGNOSIS — Z1231 Encounter for screening mammogram for malignant neoplasm of breast: Secondary | ICD-10-CM

## 2018-12-27 ENCOUNTER — Encounter: Payer: Self-pay | Admitting: Internal Medicine

## 2018-12-27 ENCOUNTER — Ambulatory Visit: Payer: 59 | Admitting: Internal Medicine

## 2018-12-27 VITALS — BP 160/88 | HR 105 | Temp 98.9°F | Ht 62.0 in | Wt 165.0 lb

## 2018-12-27 DIAGNOSIS — J0100 Acute maxillary sinusitis, unspecified: Secondary | ICD-10-CM

## 2018-12-27 DIAGNOSIS — J029 Acute pharyngitis, unspecified: Secondary | ICD-10-CM | POA: Diagnosis not present

## 2018-12-27 LAB — POCT RAPID STREP A (OFFICE): Rapid Strep A Screen: NEGATIVE

## 2018-12-27 MED ORDER — HYDROCODONE-HOMATROPINE 5-1.5 MG/5ML PO SYRP: 5.0000 mL | ORAL_SOLUTION | Freq: Three times a day (TID) | ORAL | 0 refills | Status: DC | PRN

## 2018-12-27 MED ORDER — AZITHROMYCIN 250 MG PO TABS: ORAL_TABLET | ORAL | 0 refills | Status: DC

## 2018-12-27 NOTE — Progress Notes (Signed)
   Subjective:    Patient ID: Taylor Moreno, female    DOB: [birth year], 63 y.o.   MRN: [id number]  HPI Onset on respiratory congestion,cough, sinus pressure and faigue. Having remodeling in home done with lots of dust present. Had influenza vaccine her. No fever or shaking chills. Some discolored nasal drainage.    Review of Systems see above-no fever or shaking chills.     Objective:   Physical Exam  Sounds nasally congested.  TMs are slightly full.  Pharynx clear without exudate.  Neck is supple without adenopathy.  Chest clear to auscultation.  Rapid strep screen is negative    Assessment & Plan:  Acute maxillary sinusitis  Plan: Hycodan 1 teaspoon p.o. every 8 hours as needed cough.  Zithromax Z-PAK take 2 tablets p.o. day 1 followed by 1 tablet days 2 through 5.  Rest and drink plenty of fluids.

## 2019-01-06 ENCOUNTER — Other Ambulatory Visit: Payer: Self-pay | Admitting: Internal Medicine

## 2019-01-21 NOTE — Patient Instructions (Addendum)
Take Zithromax Z-PAK as directed and Hycodan as directed for cough.  Rest and drink plenty of fluids.  Rapid strep screen is negative.

## 2019-02-07 ENCOUNTER — Other Ambulatory Visit: Payer: 59

## 2019-02-07 ENCOUNTER — Ambulatory Visit
Admission: RE | Admit: 2019-02-07 | Discharge: 2019-02-07 | Disposition: A | Discharge status: Home / Self Care | Payer: 59 | Source: Ambulatory Visit | Attending: Internal Medicine | Admitting: Internal Medicine

## 2019-02-07 DIAGNOSIS — Z1231 Encounter for screening mammogram for malignant neoplasm of breast: Secondary | ICD-10-CM | POA: Diagnosis not present

## 2019-04-11 ENCOUNTER — Other Ambulatory Visit: Payer: Self-pay

## 2019-04-11 ENCOUNTER — Other Ambulatory Visit: Payer: 59

## 2019-04-11 MED ORDER — MOMETASONE FUROATE 50 MCG/ACT NA SUSP: 2.0000 | Freq: Every day | NASAL | 11 refills | Status: DC

## 2019-04-21 ENCOUNTER — Other Ambulatory Visit: Payer: Self-pay | Admitting: Internal Medicine

## 2019-04-21 DIAGNOSIS — Z0000 Encounter for general adult medical examination without abnormal findings: Secondary | ICD-10-CM

## 2019-04-21 DIAGNOSIS — B009 Herpesviral infection, unspecified: Secondary | ICD-10-CM

## 2019-04-21 DIAGNOSIS — F329 Major depressive disorder, single episode, unspecified: Secondary | ICD-10-CM

## 2019-04-21 DIAGNOSIS — M8580 Other specified disorders of bone density and structure, unspecified site: Secondary | ICD-10-CM

## 2019-04-21 DIAGNOSIS — I10 Essential (primary) hypertension: Secondary | ICD-10-CM

## 2019-04-21 DIAGNOSIS — E7800 Pure hypercholesterolemia, unspecified: Secondary | ICD-10-CM

## 2019-04-21 DIAGNOSIS — R7309 Other abnormal glucose: Secondary | ICD-10-CM

## 2019-04-21 DIAGNOSIS — F32A Depression, unspecified: Secondary | ICD-10-CM

## 2019-04-21 NOTE — Addendum Note (Signed)
Addended by: Gregery Na on: 04/21/2019 02:20 PM   Modules accepted: Orders

## 2019-04-22 ENCOUNTER — Encounter: Payer: Self-pay | Admitting: Internal Medicine

## 2019-04-22 DIAGNOSIS — Z0000 Encounter for general adult medical examination without abnormal findings: Secondary | ICD-10-CM | POA: Diagnosis not present

## 2019-04-22 DIAGNOSIS — M8580 Other specified disorders of bone density and structure, unspecified site: Secondary | ICD-10-CM | POA: Diagnosis not present

## 2019-04-25 ENCOUNTER — Ambulatory Visit (INDEPENDENT_AMBULATORY_CARE_PROVIDER_SITE_OTHER): Payer: 59 | Admitting: Internal Medicine

## 2019-04-25 VITALS — BP 117/74 | Wt 160.8 lb

## 2019-04-25 DIAGNOSIS — E7800 Pure hypercholesterolemia, unspecified: Secondary | ICD-10-CM

## 2019-04-25 DIAGNOSIS — I10 Essential (primary) hypertension: Secondary | ICD-10-CM

## 2019-04-25 DIAGNOSIS — M8580 Other specified disorders of bone density and structure, unspecified site: Secondary | ICD-10-CM | POA: Diagnosis not present

## 2019-04-25 DIAGNOSIS — Z8659 Personal history of other mental and behavioral disorders: Secondary | ICD-10-CM

## 2019-04-25 DIAGNOSIS — B009 Herpesviral infection, unspecified: Secondary | ICD-10-CM

## 2019-04-25 NOTE — Progress Notes (Signed)
   Subjective:    Patient ID: Taylor Moreno, female    DOB: [birth year], 63 y.o.   MRN: [id number]  HPI 63 year old Female seen today by interactive audio and video telecommunications due to the Coronavirus pandemic.  She is identified as Taylor Moreno, a longstanding patient in this practice using 2 identifiers.  She is agreeable to visit in this format today.    This is a 42-month recheck appointment.  Her physical exam was done in November 2019.  In January, she was seen with an acute maxillary sinusitis.  She has been having some construction done on the inside of her home.  Has been somewhat dusty.  She has a history of hypertension and hyperlipidemia.  History of osteopenia treated with Boniva.  History of depression treated with SSRI.  Patient is on Cozaar for hypertension.  Has not wanted to be on statin medication despite my urging.  She has provided me with lab work from [employer] where she is employed.  25 minutes spent reviewing chart, medical decision making and interviewing patient with virtual visit      Review of Systems see above     Objective:   Physical Exam  Reports her BP is stable at home.   Labs show a total cholesterol of 264, LDL cholesterol 173, triglycerides 93, HDL cholesterol 72.  TSH is normal at 2.740.  Vitamin D is normal at 75.5.  Liver functions are normal.  Electrolytes are normal.    Assessment & Plan:  Essential hypertension- started Cozaar in September 2011.  Hyperlipidemia-does not want to be on low-dose statin therapy.  Tried Lipitor briefly in 2008 but declined to take it long-term.  Continue diet and exercise efforts.  I feel that she has familial hyperlipidemia based on her family history.  History of osteopenia-vitamin D level is within normal limits.  Intolerant of Fosamax.  Currently on Boniva.  History of depression- stable  History of HSV type I treated with as needed Valtrex  Plan: Continue current medications and  follow-up with physical examination in 6 months with fasting labs.  Continue diet exercise and weight loss efforts.

## 2019-05-16 ENCOUNTER — Other Ambulatory Visit: Payer: Self-pay | Admitting: Internal Medicine

## 2019-05-18 ENCOUNTER — Other Ambulatory Visit: Payer: 59

## 2019-05-20 ENCOUNTER — Other Ambulatory Visit: Payer: Self-pay

## 2019-05-20 MED ORDER — VALACYCLOVIR HCL 500 MG PO TABS: ORAL_TABLET | ORAL | 3 refills | Status: DC

## 2019-05-21 ENCOUNTER — Encounter: Payer: Self-pay | Admitting: Internal Medicine

## 2019-05-21 NOTE — Patient Instructions (Signed)
It was a pleasure to see you today by virtual visit.  Continue to work on diet and exercise efforts.  You have indicated you do not want to be on statin medication.  Continue current medications and follow-up in 6 months for routine health maintenance exam and fasting labs.

## 2019-06-03 ENCOUNTER — Ambulatory Visit
Admission: RE | Admit: 2019-06-03 | Discharge: 2019-06-03 | Disposition: A | Discharge status: Home / Self Care | Payer: 59 | Source: Ambulatory Visit | Attending: Internal Medicine | Admitting: Internal Medicine

## 2019-06-03 ENCOUNTER — Other Ambulatory Visit: Payer: Self-pay

## 2019-06-03 DIAGNOSIS — M8580 Other specified disorders of bone density and structure, unspecified site: Secondary | ICD-10-CM

## 2019-06-09 ENCOUNTER — Telehealth: Payer: Self-pay | Admitting: *Deleted

## 2019-07-11 ENCOUNTER — Other Ambulatory Visit: Payer: 59

## 2019-08-25 ENCOUNTER — Telehealth: Payer: Self-pay | Admitting: Internal Medicine

## 2019-08-25 MED ORDER — VALACYCLOVIR HCL 500 MG PO TABS: ORAL_TABLET | ORAL | 3 refills | Status: DC

## 2019-08-25 NOTE — Telephone Encounter (Signed)
Received Fax RX request from  [facility]  Medication - valACYclovir (VALTREX) 500 MG tablet   Last Refill - 08/03/19  Last OV - 04/25/19  Last CPE 10/25/18  Next CPE scheduled for 10/31/19

## 2019-10-24 ENCOUNTER — Telehealth: Payer: Self-pay | Admitting: Internal Medicine

## 2019-10-24 NOTE — Telephone Encounter (Signed)
Left voicemail, orders are ready for pick up.

## 2019-10-24 NOTE — Addendum Note (Signed)
Addended by: Mady Haagensen on: 10/24/2019 03:40 PM   Modules accepted: Orders

## 2019-10-24 NOTE — Telephone Encounter (Signed)
Taylor Moreno [phone]  [name] called to see if she could pick up her lab orders tomorrow so that she can get them done at work.

## 2019-10-31 ENCOUNTER — Encounter: Payer: Self-pay | Admitting: Internal Medicine

## 2019-10-31 ENCOUNTER — Other Ambulatory Visit: Payer: Self-pay

## 2019-10-31 ENCOUNTER — Ambulatory Visit (INDEPENDENT_AMBULATORY_CARE_PROVIDER_SITE_OTHER): Payer: 59 | Admitting: Internal Medicine

## 2019-10-31 ENCOUNTER — Other Ambulatory Visit (HOSPITAL_COMMUNITY)
Admission: RE | Admit: 2019-10-31 | Discharge: 2019-10-31 | Disposition: A | Discharge status: Home / Self Care | Payer: 59 | Source: Ambulatory Visit | Attending: Internal Medicine | Admitting: Internal Medicine

## 2019-10-31 VITALS — BP 150/80 | HR 78 | Ht 62.0 in | Wt 168.0 lb

## 2019-10-31 DIAGNOSIS — Z124 Encounter for screening for malignant neoplasm of cervix: Secondary | ICD-10-CM | POA: Insufficient documentation

## 2019-10-31 DIAGNOSIS — Z0000 Encounter for general adult medical examination without abnormal findings: Secondary | ICD-10-CM

## 2019-10-31 DIAGNOSIS — I10 Essential (primary) hypertension: Secondary | ICD-10-CM | POA: Diagnosis not present

## 2019-10-31 DIAGNOSIS — Z8659 Personal history of other mental and behavioral disorders: Secondary | ICD-10-CM | POA: Diagnosis not present

## 2019-10-31 DIAGNOSIS — M8580 Other specified disorders of bone density and structure, unspecified site: Secondary | ICD-10-CM

## 2019-10-31 DIAGNOSIS — E7800 Pure hypercholesterolemia, unspecified: Secondary | ICD-10-CM

## 2019-10-31 DIAGNOSIS — Z6830 Body mass index (BMI) 30.0-30.9, adult: Secondary | ICD-10-CM

## 2019-10-31 DIAGNOSIS — Z23 Encounter for immunization: Secondary | ICD-10-CM | POA: Diagnosis not present

## 2019-10-31 LAB — POCT URINALYSIS DIPSTICK
Appearance: NEGATIVE
Bilirubin, UA: NEGATIVE
Blood, UA: NEGATIVE
Glucose, UA: NEGATIVE
Ketones, UA: NEGATIVE
Leukocytes, UA: NEGATIVE
Nitrite, UA: NEGATIVE
Odor: NEGATIVE
Protein, UA: NEGATIVE
Spec Grav, UA: 1.01 (ref 1.010–1.025)
Urobilinogen, UA: 0.2 E.U./dL
pH, UA: 6.5 (ref 5.0–8.0)

## 2019-10-31 MED ORDER — ROSUVASTATIN CALCIUM 5 MG PO TABS: ORAL_TABLET | ORAL | 3 refills | Status: DC

## 2019-10-31 NOTE — Progress Notes (Signed)
Subjective:    Patient ID: Taylor Moreno, female    DOB: [birth year], 63 y.o.   MRN: [id number]  HPI 63 year old Female for health maintenance exam and evaluation of medical issues including osteopenia treated with Boniva.  History of depression treated with SSRI.  History of essential hypertension treated with Cozaar.  History of hyperlipidemia but has not wanted to be on statin medication.  Bone density study in June of this year showed left femoral neck T score -2.1 and previously was -2.3 in 2014.  Was off Boniva for about a year and a half but resumed it in May.  She brings labs done through employment at [place] [employer].  Total cholesterol 270, triglycerides 157, LDL cholesterol 176.  Have asked her to try Crestor once a week 5 mg.  She is agreeable to this.  Family history: Parents are deceased.  Father died of complications of dementia.  Mother died of complications from a stroke.  Social history: She seems to be doing better mentally and doing some things for herself after taking care of her parents for many years.  She is single.  Works in Librarian, academic for [employer].  Formerly smoked 1/2 pack of cigarettes daily for 17 years but quit smoking in 1984.  Has a college degree.  Social alcohol consumption.  No children.  Never married.       Review of Systems  Respiratory: Negative.   Cardiovascular: Negative.   Gastrointestinal: Negative.   Genitourinary: Negative.   Neurological: Negative.   Psychiatric/Behavioral:       History of depression       Objective:   Physical Exam Vitals signs reviewed.  Constitutional:      Appearance: Normal appearance.  HENT:     Head: Normocephalic and atraumatic.     Right Ear: Tympanic membrane normal.     Left Ear: Tympanic membrane normal.     Nose: Nose normal.     Mouth/Throat:     Mouth: Mucous membranes are moist.  Eyes:     General: No scleral icterus.    Extraocular Movements: Extraocular movements intact.   Conjunctiva/sclera: Conjunctivae normal.     Pupils: Pupils are equal, round, and reactive to light.  Neck:     Musculoskeletal: Neck supple. No neck rigidity.     Comments: No thyromegaly Cardiovascular:     Rate and Rhythm: Normal rate and regular rhythm.     Pulses: Normal pulses.     Heart sounds: No murmur.  Pulmonary:     Effort: No respiratory distress.     Breath sounds: No wheezing or rales.  Abdominal:     General: There is no distension.     Palpations: Abdomen is soft. There is no mass.     Tenderness: There is no abdominal tenderness. There is no guarding or rebound.  Genitourinary:    Comments: Pap obtained.  Bimanual normal. Musculoskeletal:     Right lower leg: No edema.     Left lower leg: No edema.  Lymphadenopathy:     Cervical: No cervical adenopathy.  Skin:    General: Skin is warm and dry.  Neurological:     General: No focal deficit present.     Mental Status: She is alert and oriented to person, place, and time.     Cranial Nerves: No cranial nerve deficit.     Motor: No weakness.     Gait: Gait normal.  Psychiatric:        Mood and  Affect: Mood normal.        Behavior: Behavior normal.        Thought Content: Thought content normal.        Judgment: Judgment normal.           Assessment & Plan:  Osteopenia-continue with Boniva and repeat bone density study in 2 years  Anxiety and depression treated with SSRI  Hyperlipidemia-does not want to be on statin medication in the past but will start Crestor 5 mg weekly.  Continue to watch diet and get some exercise.  Follow-up in 6 months.  Essential hypertension stable with Cozaar.  Blood pressure elevated today at 150/80 but patient will continue to monitor at home  BMI 30.73-watch weight  Plan: Follow-up in 6 months with lipid panel, liver functions and office visit.

## 2019-11-02 LAB — CYTOLOGY - PAP
Comment: NEGATIVE
Diagnosis: NEGATIVE
High risk HPV: NEGATIVE

## 2019-11-20 ENCOUNTER — Encounter: Payer: Self-pay | Admitting: Internal Medicine

## 2019-11-20 NOTE — Patient Instructions (Addendum)
Flu vaccine given.  Start Crestor 5 mg weekly.  Follow-up in 6 months.  Continue current medications.  Watch weight and get some exercise.  Continue Boniva.

## 2019-12-08 ENCOUNTER — Other Ambulatory Visit: Payer: Self-pay | Admitting: Internal Medicine

## 2020-01-09 ENCOUNTER — Other Ambulatory Visit: Payer: Self-pay

## 2020-01-09 MED ORDER — VALACYCLOVIR HCL 500 MG PO TABS: ORAL_TABLET | ORAL | 11 refills | Status: DC

## 2020-01-12 ENCOUNTER — Other Ambulatory Visit: Payer: Self-pay

## 2020-01-12 ENCOUNTER — Ambulatory Visit: Payer: 59 | Admitting: Internal Medicine

## 2020-01-12 ENCOUNTER — Encounter: Payer: Self-pay | Admitting: Internal Medicine

## 2020-01-12 VITALS — BP 120/80 | HR 91 | Temp 98.0°F | Ht 62.0 in | Wt 161.0 lb

## 2020-01-12 DIAGNOSIS — M7712 Lateral epicondylitis, left elbow: Secondary | ICD-10-CM

## 2020-01-12 MED ORDER — MELOXICAM 15 MG PO TABS: 15.0000 mg | ORAL_TABLET | Freq: Every day | ORAL | 1 refills | Status: DC

## 2020-01-12 NOTE — Progress Notes (Signed)
   Subjective:    Patient ID: Taylor Moreno, female    DOB: [birth year], 64 y.o.   MRN: [id number]  HPI 64 year old Female in today with left elbow pain for a number of weeks.  Does not recall any injury but has been doing work about her house that perhaps aggravated the elbow pain.  It is tender and aggravated by certain motions of the elbow.    Review of Systems see above     Objective:   Physical Exam Vital signs reviewed.  Tender lateral left epicondyle.  No elbow effusion.  No redness of the olecranon.       Assessment & Plan:  Left lateral epicondylitis  Plan: Order written for tennis elbow splint.  Meloxicam 15 mg daily.  Apply ice to the left lateral epicondyle for 20 minutes daily.  If not improving refer to orthopedist.

## 2020-01-18 ENCOUNTER — Other Ambulatory Visit: Payer: Self-pay | Admitting: Internal Medicine

## 2020-01-18 DIAGNOSIS — Z1231 Encounter for screening mammogram for malignant neoplasm of breast: Secondary | ICD-10-CM

## 2020-01-21 NOTE — Patient Instructions (Signed)
Obtain tennis elbow splint from medical supply company.  Prescription written.  Meloxicam 15 mg daily.  May apply ice for 15 minutes daily to left lateral epicondyles.  Avoid heavy lifting with left upper extremity for 2 to 4 weeks.  If not improving refer to orthopedist.

## 2020-02-16 ENCOUNTER — Telehealth: Payer: Self-pay | Admitting: Internal Medicine

## 2020-02-16 MED ORDER — MELOXICAM 15 MG PO TABS: 15.0000 mg | ORAL_TABLET | Freq: Every day | ORAL | 11 refills | Status: AC

## 2020-02-16 NOTE — Telephone Encounter (Signed)
Refill x one year °

## 2020-02-16 NOTE — Telephone Encounter (Signed)
Received Fax RX request from  Pharmacy -  [facility] [phone] [place], [place] - [address] AT [facility] [address] Phone:  [phone]  Fax:  [phone]       Medication - meloxicam (MOBIC) 15 MG tablet  Patient is requesting 90 day supply instead of 30 day supply.   Last Refill -   Last OV - 01/12/20  Last CPE - 10/31/19  Next Appointment -

## 2020-02-23 ENCOUNTER — Other Ambulatory Visit: Payer: Self-pay

## 2020-02-23 ENCOUNTER — Ambulatory Visit
Admission: RE | Admit: 2020-02-23 | Discharge: 2020-02-23 | Disposition: A | Discharge status: Home / Self Care | Payer: 59 | Source: Ambulatory Visit | Attending: Internal Medicine | Admitting: Internal Medicine

## 2020-02-23 DIAGNOSIS — Z1231 Encounter for screening mammogram for malignant neoplasm of breast: Secondary | ICD-10-CM

## 2020-05-20 ENCOUNTER — Other Ambulatory Visit: Payer: Self-pay | Admitting: Internal Medicine

## 2020-06-20 ENCOUNTER — Other Ambulatory Visit: Payer: Self-pay | Admitting: Internal Medicine

## 2021-01-10 ENCOUNTER — Other Ambulatory Visit: Payer: Self-pay | Admitting: Internal Medicine

## 2021-02-07 ENCOUNTER — Other Ambulatory Visit: Payer: Self-pay | Admitting: Internal Medicine

## 2021-02-07 DIAGNOSIS — Z1231 Encounter for screening mammogram for malignant neoplasm of breast: Secondary | ICD-10-CM

## 2021-03-28 ENCOUNTER — Other Ambulatory Visit: Payer: Self-pay

## 2021-03-28 ENCOUNTER — Ambulatory Visit
Admission: RE | Admit: 2021-03-28 | Discharge: 2021-03-28 | Disposition: A | Discharge status: Home / Self Care | Payer: 59 | Source: Ambulatory Visit | Attending: Internal Medicine | Admitting: Internal Medicine

## 2021-03-28 DIAGNOSIS — Z1231 Encounter for screening mammogram for malignant neoplasm of breast: Secondary | ICD-10-CM

## 2021-06-05 ENCOUNTER — Other Ambulatory Visit: Payer: Self-pay

## 2021-06-05 MED ORDER — ROSUVASTATIN CALCIUM 5 MG PO TABS: ORAL_TABLET | ORAL | 3 refills | Status: DC

## 2022-03-03 ENCOUNTER — Other Ambulatory Visit: Payer: Self-pay | Admitting: Internal Medicine

## 2022-03-03 DIAGNOSIS — Z1231 Encounter for screening mammogram for malignant neoplasm of breast: Secondary | ICD-10-CM

## 2022-04-17 ENCOUNTER — Ambulatory Visit
Admission: RE | Admit: 2022-04-17 | Discharge: 2022-04-17 | Disposition: A | Discharge status: Home / Self Care | Payer: 59 | Source: Ambulatory Visit | Attending: Internal Medicine | Admitting: Internal Medicine

## 2022-04-17 DIAGNOSIS — Z1231 Encounter for screening mammogram for malignant neoplasm of breast: Secondary | ICD-10-CM

## 2022-06-16 ENCOUNTER — Ambulatory Visit (AMBULATORY_SURGERY_CENTER): Payer: Self-pay | Admitting: *Deleted

## 2022-06-16 ENCOUNTER — Encounter: Payer: Self-pay | Admitting: Gastroenterology

## 2022-06-16 VITALS — Ht 62.0 in | Wt 162.0 lb

## 2022-06-16 DIAGNOSIS — Z1211 Encounter for screening for malignant neoplasm of colon: Secondary | ICD-10-CM

## 2022-06-16 MED ORDER — NA SULFATE-K SULFATE-MG SULF 17.5-3.13-1.6 GM/177ML PO SOLN: 1.0000 | Freq: Once | ORAL | 0 refills | Status: AC

## 2022-07-01 ENCOUNTER — Encounter: Payer: Self-pay | Admitting: Gastroenterology

## 2022-07-01 ENCOUNTER — Ambulatory Visit (AMBULATORY_SURGERY_CENTER): Payer: 59 | Admitting: Gastroenterology

## 2022-07-01 VITALS — BP 111/74 | HR 77 | Temp 97.3°F | Resp 16 | Ht 62.0 in | Wt 162.0 lb

## 2022-07-01 DIAGNOSIS — Z1211 Encounter for screening for malignant neoplasm of colon: Secondary | ICD-10-CM

## 2022-07-01 MED ORDER — SODIUM CHLORIDE 0.9 % IV SOLN: 500.0000 mL | Freq: Once | INTRAVENOUS | Status: DC

## 2022-07-01 NOTE — Op Note (Signed)
[facility] Patient Name: Taylor Moreno Procedure Date: 07/01/2022 9:30 AM MRN: [id number] Endoscopist: Lorin Picket E. Tomasa Rand , MD Age: 66 Referring MD:  Date of Birth: Apr 23, 1956 Gender: Female Account #: [id number] Procedure:                Colonoscopy Indications:              Screening for colorectal malignant neoplasm (last                            colonoscopy was 10 years ago) Medicines:                Monitored Anesthesia Care Procedure:                Pre-Anesthesia Assessment:                           - Prior to the procedure, a History and Physical                            was performed, and patient medications and                            allergies were reviewed. The patient's tolerance of                            previous anesthesia was also reviewed. The risks                            and benefits of the procedure and the sedation                            options and risks were discussed with the patient.                            All questions were answered, and informed consent                            was obtained. Prior Anticoagulants: The patient has                            taken no previous anticoagulant or antiplatelet                            agents. ASA Grade Assessment: II - A patient with                            mild systemic disease. After reviewing the risks                            and benefits, the patient was deemed in                            satisfactory condition to undergo the procedure.  After obtaining informed consent, the colonoscope                            was passed under direct vision. Throughout the                            procedure, the patient's blood pressure, pulse, and                            oxygen saturations were monitored continuously. The                            Colonoscope was introduced through the anus and                            advanced to the the  terminal ileum, with                            identification of the appendiceal orifice and IC                            valve. The colonoscopy was performed without                            difficulty. The patient tolerated the procedure                            well. The quality of the bowel preparation was                            adequate. The terminal ileum, ileocecal valve,                            appendiceal orifice, and rectum were photographed.                            The bowel preparation used was SUPREP via split                            dose instruction. Scope In: 9:38:39 AM Scope Out: 9:56:40 AM Scope Withdrawal Time: 0 hours 11 minutes 58 seconds  Total Procedure Duration: 0 hours 18 minutes 1 second  Findings:                 The perianal and digital rectal examinations were                            normal. Pertinent negatives include normal                            sphincter tone and no palpable rectal lesions.                           A few small-mouthed diverticula were found in the  sigmoid colon and descending colon.                           The exam was otherwise normal throughout the                            examined colon.                           The terminal ileum appeared normal.                           The retroflexed view of the distal rectum and anal                            verge was normal and showed no anal or rectal                            abnormalities. Complications:            No immediate complications. Estimated Blood Loss:     Estimated blood loss: none. Impression:               - Diverticulosis in the sigmoid colon and in the                            descending colon.                           - The examined portion of the ileum was normal.                           - The distal rectum and anal verge are normal on                            retroflexion view.                            - No specimens collected. Recommendation:           - Patient has a contact number available for                            emergencies. The signs and symptoms of potential                            delayed complications were discussed with the                            patient. Return to normal activities tomorrow.                            Written discharge instructions were provided to the                            patient.                           -  Resume previous diet.                           - Continue present medications.                           - Repeat colonoscopy in 10 years for screening                            purposes. Lianah Peed E. Tomasa Rand, MD 07/01/2022 10:00:15 AM This report has been signed electronically.

## 2022-07-01 NOTE — Patient Instructions (Addendum)
Handout on diverticulosis provided   Continue current medications.   Repeat colonoscopy  in 10 years   YOU HAD AN ENDOSCOPIC PROCEDURE TODAY AT THE [facility]:   Refer to the procedure report that was given to you for any specific questions about what was found during the examination.  If the procedure report does not answer your questions, please call your gastroenterologist to clarify.  If you requested that your care partner not be given the details of your procedure findings, then the procedure report has been included in a sealed envelope for you to review at your convenience later.  YOU SHOULD EXPECT: Some feelings of bloating in the abdomen. Passage of more gas than usual.  Walking can help get rid of the air that was put into your GI tract during the procedure and reduce the bloating. If you had a lower endoscopy (such as a colonoscopy or flexible sigmoidoscopy) you may notice spotting of blood in your stool or on the toilet paper. If you underwent a bowel prep for your procedure, you may not have a normal bowel movement for a few days.  Please Note:  You might notice some irritation and congestion in your nose or some drainage.  This is from the oxygen used during your procedure.  There is no need for concern and it should clear up in a day or so.  SYMPTOMS TO REPORT IMMEDIATELY:  Following lower endoscopy (colonoscopy or flexible sigmoidoscopy):  Excessive amounts of blood in the stool  Significant tenderness or worsening of abdominal pains  Swelling of the abdomen that is new, acute  Fever of 100F or higher  For urgent or emergent issues, a gastroenterologist can be reached at any hour by calling [phone]. Do not use MyChart messaging for urgent concerns.    DIET:  We do recommend a small meal at first, but then you may proceed to your regular diet.  Drink plenty of fluids but you should avoid alcoholic beverages for 24 hours.  ACTIVITY:  You should plan to  take it easy for the rest of today and you should NOT DRIVE or use heavy machinery until tomorrow (because of the sedation medicines used during the test).    FOLLOW UP: Our staff will call the number listed on your records the next business day following your procedure.  We will call around 7:15- 8:00 am to check on you and address any questions or concerns that you may have regarding the information given to you following your procedure. If we do not reach you, we will leave a message.  If you develop any symptoms (ie: fever, flu-like symptoms, shortness of breath, cough etc.) before then, please call [phone].  If you test positive for Covid 19 in the 2 weeks post procedure, please call and report this information to [place].    If any biopsies were taken you will be contacted by phone or by letter within the next 1-3 weeks.  Please call us at [phone] if you have not heard about the biopsies in 3 weeks.    SIGNATURES/CONFIDENTIALITY: You and/or your care partner have signed paperwork which will be entered into your electronic medical record.  These signatures attest to the fact that that the information above on your After Visit Summary has been reviewed and is understood.  Full responsibility of the confidentiality of this discharge information lies with you and/or your care-partner.

## 2022-07-01 NOTE — Progress Notes (Signed)
[facility] History and Physical   Primary Care Physician:  Margaree Mackintosh, MD   Reason for Procedure:   Colon cancer screening  Plan:    Screening colonoscopy     HPI: Taylor Moreno is a 66 y.o. female undergoing average risk screening colonoscopy.  She has no family history of colon cancer and no chronic GI symptoms.  She had a normal colonoscopy in 2013.   Past Medical History:  Diagnosis Date   Allergy    seasonal   Anemia    as child   Arthritis    back, foot   Cataract    forming both   Depression    Hyperlipidemia    Hypertension    Osteopenia    Osteopenia    Perimenopause    PONV (postoperative nausea and vomiting)    TMJ (dislocation of temporomandibular joint)     Past Surgical History:  Procedure Laterality Date   ANKLE SURGERY  12/22/2000   left   COLONOSCOPY  2013   normal   ROTATOR CUFF REPAIR  12/22/2008   Left shoulder   TONSILLECTOMY     torn cartilage repair  12/02 and 10/03   right knee in 2002; left knee 2003    Prior to Admission medications   Medication Sig Start Date End Date Taking? Authorizing Provider  Cholecalciferol (VITAMIN D-3) 1000 units CAPS Take 8,000 Units by mouth.   Yes [provider]  gabapentin (NEURONTIN) 100 MG capsule Take 100 mg by mouth at bedtime. 04/29/22  Yes [provider]  L-THEANINE PO Take by mouth.   Yes [provider]  Misc Natural Products (ELDERBERRY/VITAMIN C/ZINC PO) Take by mouth.   Yes [provider]  Omega-3 Fatty Acids (FISH OIL) 1000 MG CAPS Take by mouth.   Yes [provider]  RESTASIS 0.05 % ophthalmic emulsion 1 drop 2 (two) times daily. 12/24/21  Yes [provider]  vitamin B-12 (CYANOCOBALAMIN) 1000 MCG tablet Take 2,000 mcg by mouth daily.   Yes [provider]  LOTEMAX 0.5 % OINT  04/09/15   [provider]  meloxicam (MOBIC) 15 MG tablet Take 1 tablet (15 mg total) by mouth daily. Patient not taking:  Reported on 06/16/2022 02/16/20   Margaree Mackintosh, MD  mometasone (NASONEX) 50 MCG/ACT nasal spray USE 2 SPRAYS NASALLY DAILY Patient not taking: Reported on 06/16/2022 06/20/20   Margaree Mackintosh, MD  valACYclovir (VALTREX) 500 MG tablet TAKE 1 TABLET BY MOUTH TWICE DAILY FOR 5 DAYS Patient not taking: Reported on 06/16/2022 01/10/21   Margaree Mackintosh, MD    Current Outpatient Medications  Medication Sig Dispense Refill   Cholecalciferol (VITAMIN D-3) 1000 units CAPS Take 8,000 Units by mouth.     gabapentin (NEURONTIN) 100 MG capsule Take 100 mg by mouth at bedtime.     L-THEANINE PO Take by mouth.     Misc Natural Products (ELDERBERRY/VITAMIN C/ZINC PO) Take by mouth.     Omega-3 Fatty Acids (FISH OIL) 1000 MG CAPS Take by mouth.     RESTASIS 0.05 % ophthalmic emulsion 1 drop 2 (two) times daily.     vitamin B-12 (CYANOCOBALAMIN) 1000 MCG tablet Take 2,000 mcg by mouth daily.     LOTEMAX 0.5 % OINT      meloxicam (MOBIC) 15 MG tablet Take 1 tablet (15 mg total) by mouth daily. (Patient not taking: Reported on 06/16/2022) 30 tablet 11   mometasone (NASONEX) 50 MCG/ACT nasal spray USE 2 SPRAYS  NASALLY DAILY (Patient not taking: Reported on 06/16/2022) 51 g prn   valACYclovir (VALTREX) 500 MG tablet TAKE 1 TABLET BY MOUTH TWICE DAILY FOR 5 DAYS (Patient not taking: Reported on 06/16/2022) 10 tablet 11   Current Facility-Administered Medications  Medication Dose Route Frequency Provider Last Rate Last Admin   0.9 %  sodium chloride infusion  500 mL Intravenous Once Jenel Lucks, MD        Allergies as of 07/01/2022 - Review Complete 07/01/2022  Allergen Reaction Noted   Alendronate sodium Other (See Comments) 09/26/2011    Family History  Problem Relation Age of Onset   Other Mother    Prostate cancer Father    Alzheimer's disease Father    Colon polyps Sister    Prostate cancer Maternal Grandfather    Esophageal cancer Paternal Grandfather    Colon cancer Neg Hx    Breast cancer Neg  Hx    Stomach cancer Neg Hx    Rectal cancer Neg Hx     Social History   Socioeconomic History   Marital status: Single    Spouse name: Not on file   Number of children: Not on file   Years of education: Not on file   Highest education level: Not on file  Occupational History   Not on file  Tobacco Use   Smoking status: Former    Packs/day: 0.50    Years: 30.00    Total pack years: 15.00    Types: Cigarettes    Quit date: 12/23/2007    Years since quitting: 14.5   Smokeless tobacco: Never  Substance and Sexual Activity   Alcohol use: Yes    Alcohol/week: 23.0 standard drinks of alcohol    Types: 23 Glasses of wine per week    Comment: 3-4 A DAY    Drug use: No   Sexual activity: Never  Other Topics Concern   Not on file  Social History Narrative   Not on file   Social Determinants of Health   Financial Resource Strain: Not on file  Food Insecurity: Not on file  Transportation Needs: Not on file  Physical Activity: Not on file  Stress: Not on file  Social Connections: Not on file  Intimate Partner Violence: Not on file    Review of Systems:  All other review of systems negative except as mentioned in the HPI.  Physical Exam: Vital signs BP (!) 180/91   Pulse 69   Temp (!) 97.3 F (36.3 C)   Ht 5\' 2"  (1.575 m)   Wt 162 lb (73.5 kg)   SpO2 98%   BMI 29.63 kg/m   General:   Alert,  Well-developed, well-nourished, pleasant and cooperative in NAD Airway:  Mallampati 2 Lungs:  Clear throughout to auscultation.   Heart:  Regular rate and rhythm; no murmurs, clicks, rubs,  or gallops. Abdomen:  Soft, nontender and nondistended. Normal bowel sounds.   Neuro/Psych:  Normal mood and affect. A and O x 3   Aizza Santiago E. , MD [facility]

## 2022-07-01 NOTE — Progress Notes (Signed)
Sedate, gd SR, tolerated procedure well, VSS, report to RN 

## 2022-07-01 NOTE — Progress Notes (Signed)
Pt's states no medical or surgical changes since previsit or office visit. 

## 2022-07-02 ENCOUNTER — Telehealth: Payer: Self-pay

## 2022-07-02 NOTE — Telephone Encounter (Signed)
  Follow up Call-     07/01/2022    8:56 AM  Call back number  Post procedure Call Back phone  # [phone]  Permission to leave phone message Yes    Left message

## 2022-09-21 ENCOUNTER — Other Ambulatory Visit: Payer: Self-pay | Admitting: Sports Medicine

## 2022-09-21 MED ORDER — TRAMADOL HCL 50 MG PO TABS: 50.0000 mg | ORAL_TABLET | Freq: Four times a day (QID) | ORAL | 0 refills | Status: AC | PRN

## 2023-03-16 ENCOUNTER — Other Ambulatory Visit: Payer: Self-pay | Admitting: Internal Medicine

## 2023-03-16 DIAGNOSIS — Z1231 Encounter for screening mammogram for malignant neoplasm of breast: Secondary | ICD-10-CM

## 2023-04-29 ENCOUNTER — Ambulatory Visit
Admission: RE | Admit: 2023-04-29 | Discharge: 2023-04-29 | Disposition: A | Discharge status: Home / Self Care | Payer: 59 | Source: Ambulatory Visit | Attending: Internal Medicine | Admitting: Internal Medicine

## 2023-04-29 DIAGNOSIS — Z1231 Encounter for screening mammogram for malignant neoplasm of breast: Secondary | ICD-10-CM

## 2024-02-15 IMAGING — MG MM DIGITAL SCREENING BILAT W/ TOMO AND CAD
8 series · 8 of 24 positions shown · non-contrast
Comparison: Previous exam(s).

CLINICAL DATA: Screening.

EXAM:
DIGITAL SCREENING BILATERAL MAMMOGRAM WITH TOMOSYNTHESIS AND CAD
TECHNIQUE: Bilateral screening digital craniocaudal and mediolateral oblique
mammograms were obtained. Bilateral screening digital breast
tomosynthesis was performed. The images were evaluated with
computer-aided detection.

[R CC synth-2D]
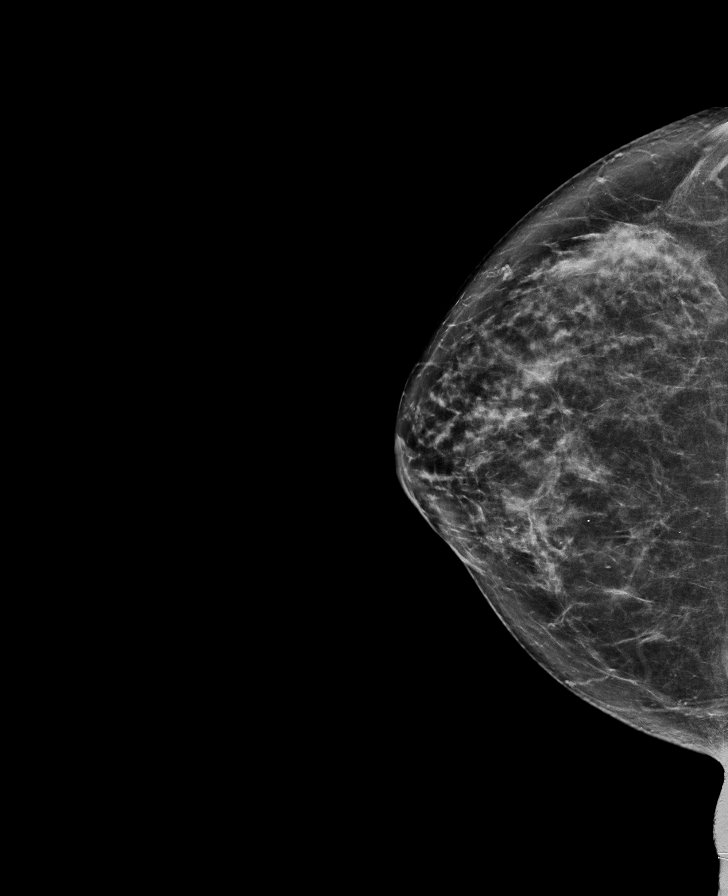

[L MLO synth-2D]
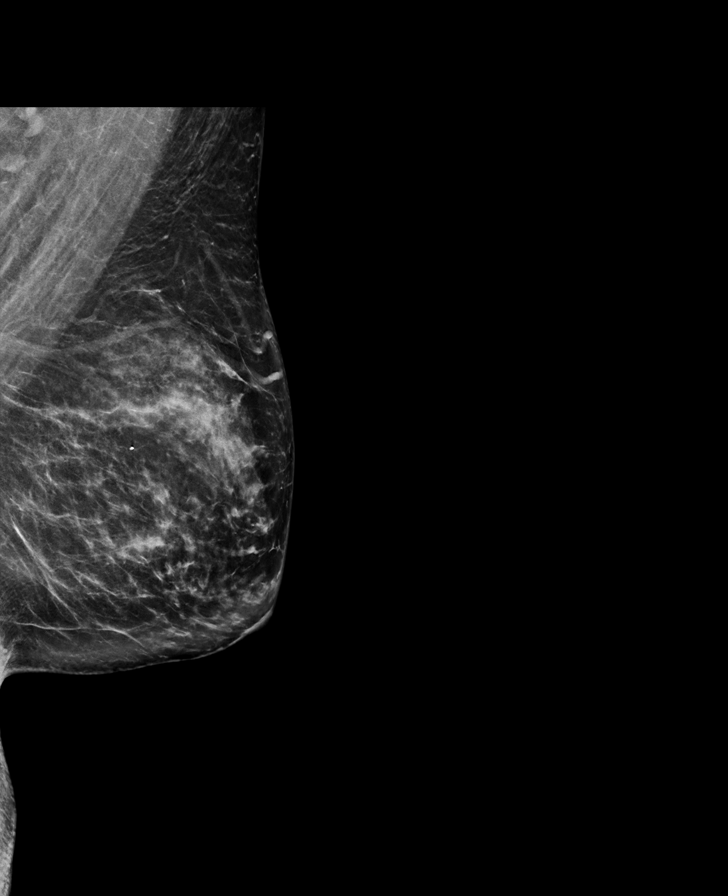

[L CC synth-2D]
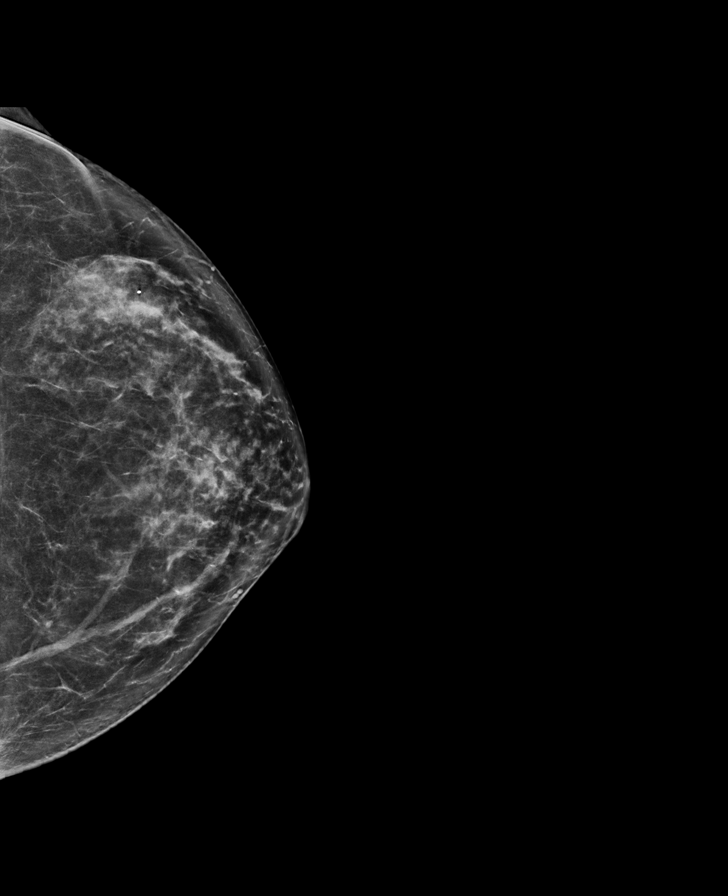

[R MLO synth-2D]
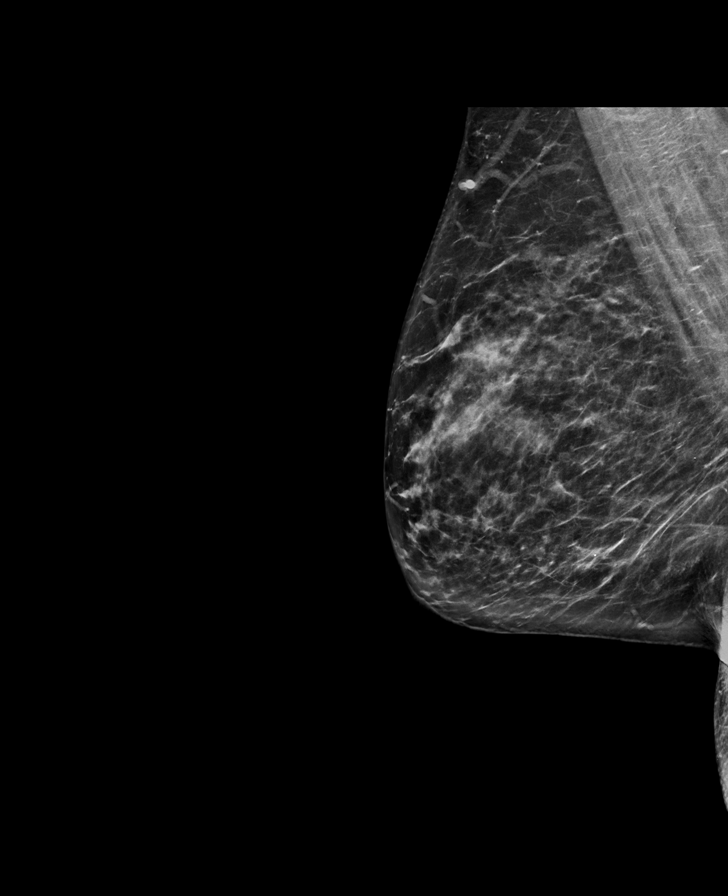

[L CC tomo · tomo slice 37/74.0]
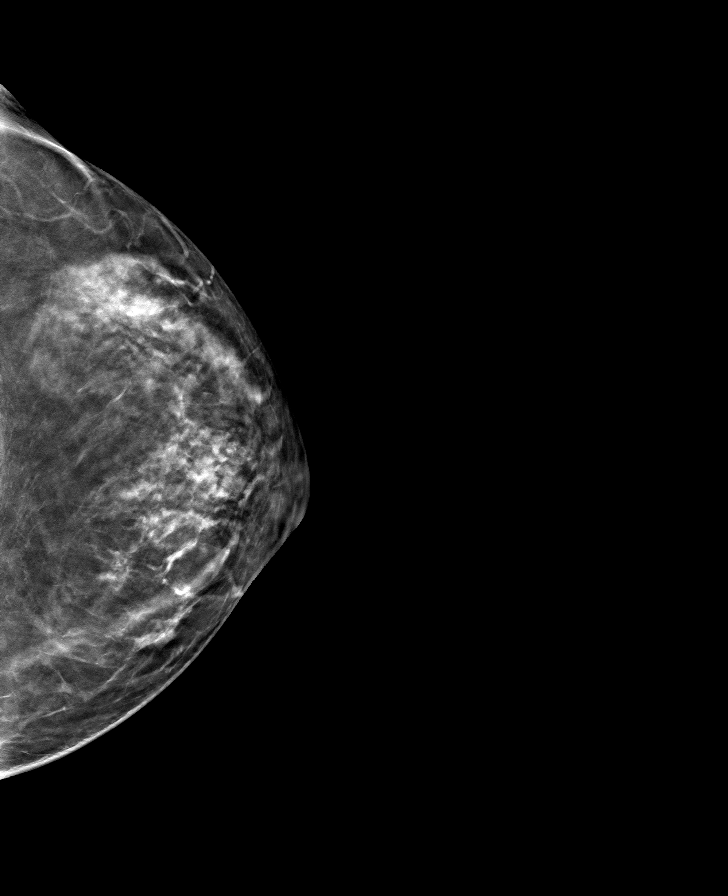

[L MLO tomo · tomo slice 41/82.0]
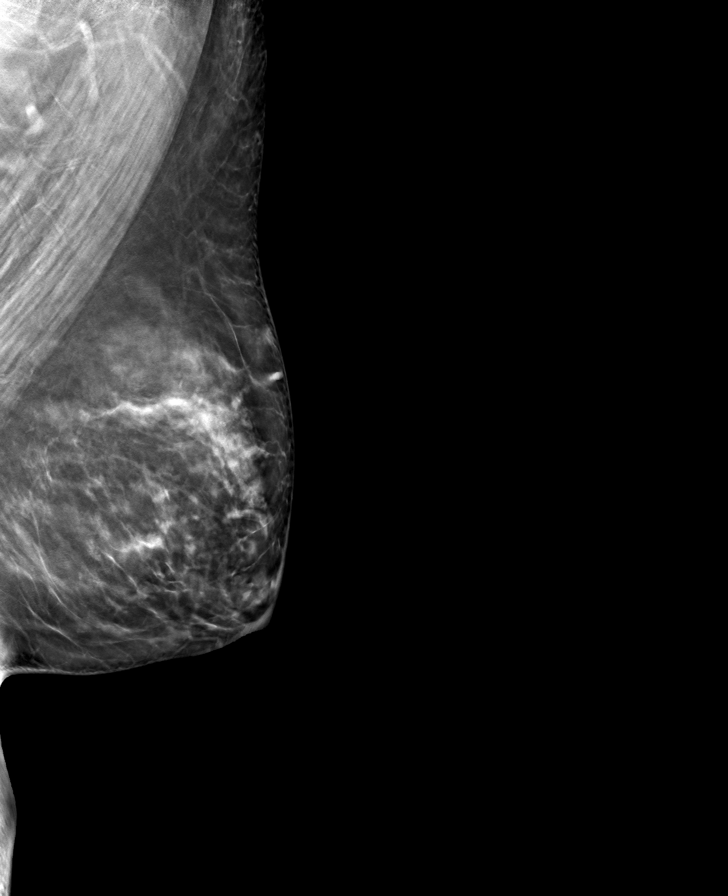

[R CC tomo · tomo slice 39/76.0]
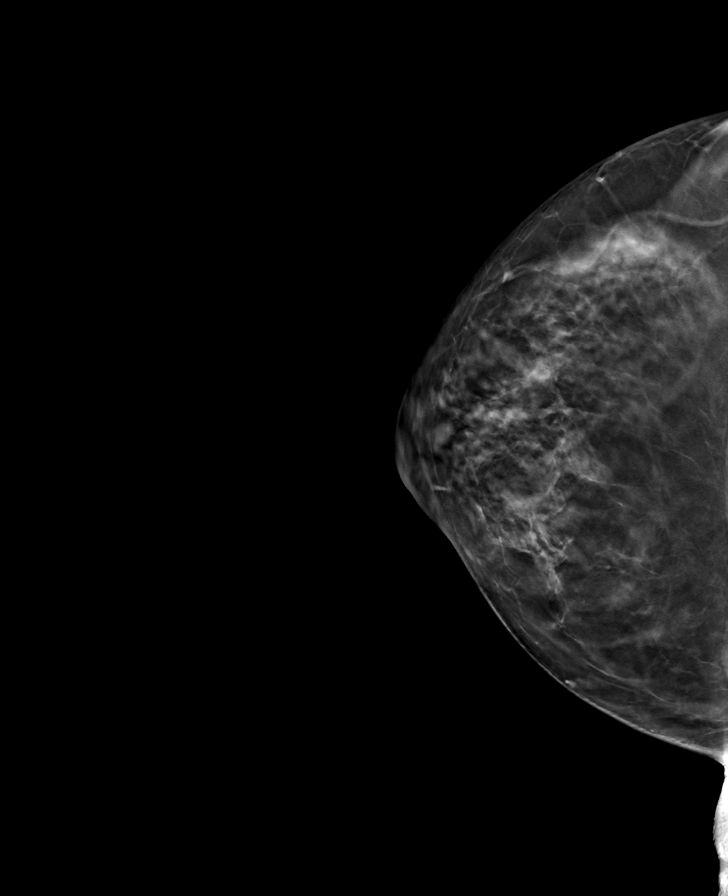

[R MLO tomo · tomo slice 39/76.0]
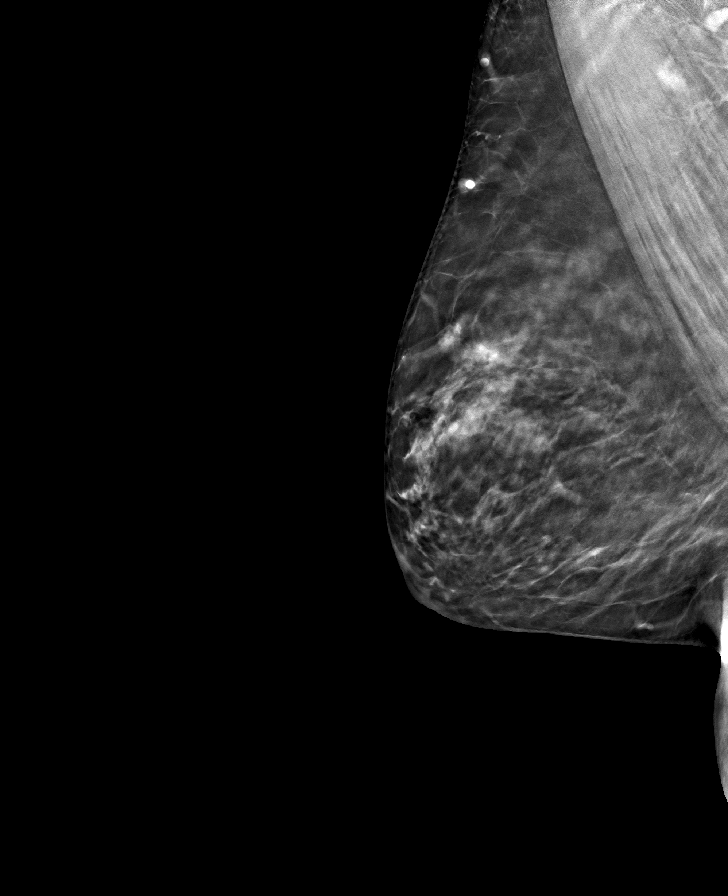

[8 of 24 positions shown; findings below may reference images not displayed]

ACR Breast Density Category c: The breast tissue is heterogeneously
dense, which may obscure small masses.
FINDINGS: There are no findings suspicious for malignancy.
IMPRESSION: No mammographic evidence of malignancy. A result letter of this
screening mammogram will be mailed directly to the patient.

RECOMMENDATION:
Screening mammogram in one year. (Code:Q3-W-BC3)

BI-RADS CATEGORY  1: Negative.

## 2024-04-04 ENCOUNTER — Other Ambulatory Visit: Payer: Self-pay | Admitting: Internal Medicine

## 2024-04-04 DIAGNOSIS — Z1231 Encounter for screening mammogram for malignant neoplasm of breast: Secondary | ICD-10-CM

## 2024-05-13 ENCOUNTER — Ambulatory Visit
Admission: RE | Admit: 2024-05-13 | Discharge: 2024-05-13 | Disposition: A | Discharge status: Home / Self Care | Source: Ambulatory Visit | Attending: Internal Medicine | Admitting: Internal Medicine

## 2024-05-13 DIAGNOSIS — Z1231 Encounter for screening mammogram for malignant neoplasm of breast: Secondary | ICD-10-CM
